# Patient Record
Sex: Female | Born: 1960 | Race: White | Hispanic: No | Marital: Married | Smoking: Former smoker
Health system: Southern US, Community
[De-identification: ages and names within clinical notes are randomized; demographics above are authoritative.]

## PROBLEM LIST (undated history)

## (undated) DIAGNOSIS — F329 Major depressive disorder, single episode, unspecified: Secondary | ICD-10-CM

## (undated) DIAGNOSIS — I10 Essential (primary) hypertension: Secondary | ICD-10-CM

## (undated) DIAGNOSIS — F32A Depression, unspecified: Secondary | ICD-10-CM

## (undated) DIAGNOSIS — E785 Hyperlipidemia, unspecified: Secondary | ICD-10-CM

## (undated) HISTORY — PX: TONSILLECTOMY AND ADENOIDECTOMY: SUR1326

## (undated) HISTORY — DX: Hyperlipidemia, unspecified: E78.5

## (undated) HISTORY — PX: TUBAL LIGATION: SHX77

## (undated) HISTORY — PX: APPENDECTOMY: SHX54

---

## 2012-03-15 ENCOUNTER — Ambulatory Visit (HOSPITAL_COMMUNITY)
Admission: RE | Admit: 2012-03-15 | Discharge: 2012-03-15 | Disposition: A | Discharge status: Home / Self Care | Payer: Managed Care, Other (non HMO) | Source: Ambulatory Visit | Attending: Internal Medicine | Admitting: Internal Medicine

## 2012-03-15 ENCOUNTER — Inpatient Hospital Stay (HOSPITAL_COMMUNITY)
Admission: EM | Admit: 2012-03-15 | Discharge: 2012-03-17 | DRG: 340 | Disposition: A | Discharge status: Home / Self Care | Payer: Managed Care, Other (non HMO) | Attending: General Surgery | Admitting: General Surgery

## 2012-03-15 ENCOUNTER — Encounter (HOSPITAL_COMMUNITY): Payer: Self-pay | Admitting: Emergency Medicine

## 2012-03-15 ENCOUNTER — Other Ambulatory Visit (HOSPITAL_COMMUNITY): Payer: Self-pay

## 2012-03-15 ENCOUNTER — Ambulatory Visit: Payer: Self-pay | Admitting: Internal Medicine

## 2012-03-15 DIAGNOSIS — Z87891 Personal history of nicotine dependence: Secondary | ICD-10-CM

## 2012-03-15 DIAGNOSIS — K3580 Unspecified acute appendicitis: Secondary | ICD-10-CM

## 2012-03-15 DIAGNOSIS — E669 Obesity, unspecified: Secondary | ICD-10-CM | POA: Diagnosis present

## 2012-03-15 DIAGNOSIS — K352 Acute appendicitis with generalized peritonitis, without abscess: Secondary | ICD-10-CM | POA: Insufficient documentation

## 2012-03-15 DIAGNOSIS — F329 Major depressive disorder, single episode, unspecified: Secondary | ICD-10-CM | POA: Diagnosis present

## 2012-03-15 DIAGNOSIS — K7689 Other specified diseases of liver: Secondary | ICD-10-CM | POA: Insufficient documentation

## 2012-03-15 DIAGNOSIS — D72829 Elevated white blood cell count, unspecified: Secondary | ICD-10-CM

## 2012-03-15 DIAGNOSIS — I10 Essential (primary) hypertension: Secondary | ICD-10-CM | POA: Diagnosis present

## 2012-03-15 DIAGNOSIS — R109 Unspecified abdominal pain: Secondary | ICD-10-CM

## 2012-03-15 DIAGNOSIS — K35209 Acute appendicitis with generalized peritonitis, without abscess, unspecified as to perforation: Secondary | ICD-10-CM | POA: Insufficient documentation

## 2012-03-15 DIAGNOSIS — Z6833 Body mass index (BMI) 33.0-33.9, adult: Secondary | ICD-10-CM

## 2012-03-15 DIAGNOSIS — F3289 Other specified depressive episodes: Secondary | ICD-10-CM | POA: Diagnosis present

## 2012-03-15 HISTORY — DX: Essential (primary) hypertension: I10

## 2012-03-15 HISTORY — DX: Depression, unspecified: F32.A

## 2012-03-15 HISTORY — DX: Major depressive disorder, single episode, unspecified: F32.9

## 2012-03-15 LAB — POCT UA - MICROSCOPIC ONLY
Crystals, Ur, HPF, POC: NEGATIVE
Yeast, UA: NEGATIVE

## 2012-03-15 LAB — POCT CBC
Granulocyte percent: 84.1 %G — AB (ref 37–80)
Hemoglobin: 11.9 g/dL — AB (ref 12.2–16.2)
Lymph, poc: 2.4 (ref 0.6–3.4)
MCHC: 32 g/dL (ref 31.8–35.4)
MPV: 7.7 fL (ref 0–99.8)
POC Granulocyte: 17.3 — AB (ref 2–6.9)
POC MID %: 4.1 %M (ref 0–12)
RDW, POC: 12.6 %

## 2012-03-15 LAB — POCT URINALYSIS DIPSTICK
Nitrite, UA: NEGATIVE
Protein, UA: NEGATIVE
pH, UA: 5.5

## 2012-03-15 MED ORDER — SODIUM CHLORIDE 0.9 % IV SOLN
INTRAVENOUS | Status: DC
  Administered 2012-03-15 – 2012-03-16 (×2): via INTRAVENOUS

## 2012-03-15 MED ORDER — IOHEXOL 300 MG/ML  SOLN
100.0000 mL | Freq: Once | INTRAMUSCULAR | Status: AC | PRN
  Administered 2012-03-15: 100 mL via INTRAVENOUS

## 2012-03-15 MED ORDER — SODIUM CHLORIDE 0.9 % IV SOLN
1.0000 g | Freq: Once | INTRAVENOUS | Status: AC
  Administered 2012-03-15: 23:00:00 via INTRAVENOUS
  Filled 2012-03-15: qty 1

## 2012-03-15 MED ORDER — BUPIVACAINE HCL (PF) 0.25 % IJ SOLN
INTRAMUSCULAR | Status: AC
  Filled 2012-03-15: qty 30

## 2012-03-15 MED ORDER — ONDANSETRON HCL 4 MG/2ML IJ SOLN
4.0000 mg | INTRAMUSCULAR | Status: DC | PRN
  Administered 2012-03-16: 4 mg via INTRAVENOUS
  Filled 2012-03-15: qty 2

## 2012-03-15 NOTE — ED Notes (Signed)
Pt states she started having abd pain yesterday that has stayed through today  Pt went to urgent care today and had blood work done that showed elevated WBC so they then ordered a CT scan that was positive for appendicitis  Pt sent here for further treatment

## 2012-03-15 NOTE — H&P (Signed)
Candice Mcbride is an 51 y.o. female.    Chief Complaint: abdominal pain, acute appendicitis  HPI: Patient is a 51 yo WF with > 24 hour hx of abdominal pain localizing to the RLQ.  Mild nausea and emesis.  Seen today by primary MD.  WBC elevated at 20K.  CT abdomen obtained at hospital - positive for acute appendicitis with probable perforation and appendicolith.  Will admit to general surgery service and proceed with appendectomy now.  Past Medical History  Diagnosis Date  . Hypertension   . Depression     Past Surgical History  Procedure Date  . Cesarean section   . Tonsillectomy and adenoidectomy     Family History  Problem Relation Age of Onset  . Heart attack Father   . Hypertension Other   . Diabetes Other    Social History:  reports that she has quit smoking. She does not have any smokeless tobacco history on file. She reports that she drinks alcohol. She reports that she does not use illicit drugs.  Allergies:  Allergies  Allergen Reactions  . Codeine     Headache   . Morphine And Related     Headache      (Not in a hospital admission)  Results for orders placed in visit on 03/15/12 (from the past 48 hour(s))  POCT CBC     Status: Abnormal   Collection Time   03/15/12  6:56 PM      Component Value Range Comment   WBC 20.6 (*) 4.6 - 10.2 (K/uL)    Lymph, poc 2.4  0.6 - 3.4     POC LYMPH PERCENT 11.8  10 - 50 (%L)    MID (cbc) 0.8  0 - 0.9     POC MID % 4.1  0 - 12 (%M)    POC Granulocyte 17.3 (*) 2 - 6.9     Granulocyte percent 84.1 (*) 37 - 80 (%G)    RBC 3.97 (*) 4.04 - 5.48 (M/uL)    Hemoglobin 11.9 (*) 12.2 - 16.2 (g/dL)    HCT, POC 04.5 (*) 40.9 - 47.9 (%)    MCV 93.6  80 - 97 (fL)    MCH, POC 30.0  27 - 31.2 (pg)    MCHC 32.0  31.8 - 35.4 (g/dL)    RDW, POC 81.1      Platelet Count, POC 353  142 - 424 (K/uL)    MPV 7.7  0 - 99.8 (fL)   POCT UA - MICROSCOPIC ONLY     Status: Normal   Collection Time   03/15/12  6:57 PM      Component Value Range  Comment   WBC, Ur, HPF, POC 3-6      RBC, urine, microscopic neg      Bacteria, U Microscopic trace      Mucus, UA neg      Epithelial cells, urine per micros 1-3      Crystals, Ur, HPF, POC neg      Casts, Ur, LPF, POC neg      Yeast, UA neg     POCT URINALYSIS DIPSTICK     Status: Normal   Collection Time   03/15/12  6:57 PM      Component Value Range Comment   Color, UA yellow      Clarity, UA clear      Glucose, UA neg      Bilirubin, UA neg      Ketones, UA neg  Spec Grav, UA 1.025      Blood, UA neg      pH, UA 5.5      Protein, UA neg      Urobilinogen, UA 0.2      Nitrite, UA neg      Leukocytes, UA Trace      Ct Abdomen Pelvis W Contrast  03/15/2012  *RADIOLOGY REPORT*  Clinical Data: 51 year old female with right lower quadrant pain and leukocytosis.  CT ABDOMEN AND PELVIS WITH CONTRAST  Technique:  Multidetector CT imaging of the abdomen and pelvis was performed following the standard protocol during bolus administration of intravenous contrast.  Contrast: OMNIPAQUE IOHEXOL 300 MG/ML  SOLN  Comparison: None.  Findings: Minor lung base atelectasis.  No pleural effusion. No acute osseous abnormality identified.  No pelvic free fluid. Negative bladder.  Negative uterus and left adnexa.  The right adnexa is in proximity to the right lower quadrant inflammatory changes, see below.  Negative distal colon.  Negative transverse colon and hepatic flexure.  There are mild inflammatory changes where the appendix emerges from the cecum, and the appendix.  Dilated up to 9 mm.  There is evidence of an appendicolith in the more distal appendix (series 2 image 61).  The tip of the appendix appears to a ruptured with trace extraluminal gas and fluid (coronal images 59, 62) and surrounding inflammatory stranding.  The terminal ileum and right adnexa are inseparable from a portion of the appendix (series 2 image 63).  The cecum otherwise is not inflamed.  Oral contrast has not yet reached  the terminal ileum.  No dilated small bowel loops.  No free intraperitoneal air.  Negative stomach, duodenum, gallbladder, spleen, pancreas, adrenal glands, kidneys and portal venous system. Major arterial structures are normal except for mild atherosclerosis.  Diffuse liver low density in keeping with steatosis.  Mild fatty sparing in the gallbladder fossa.  No abdominal free fluid.  IMPRESSION: 1.  Acute appendicitis with a small contained perforation at the appendiceal tip. Appendicolith in the midportion of the appendix. 2.  No abscess.  The terminal ileum and right adnexa are inseparable from the inflammatory change. 3.  Hepatic steatosis.  Message left for Dr. Merla Riches at 2130 hrs on 03/15/2012.  The patient is being held in the CT area at [facility].  Additional attempts will be made to reach Dr. Merla Riches, but regardless the patient will be transferred shortly to the [facility] ED for further evaluation and care.  Original Report Authenticated By: Harley Hallmark, M.D.    Review of Systems  Constitutional: Positive for fever, chills and diaphoresis. Negative for weight loss and malaise/fatigue.  HENT: Negative.   Eyes: Negative.   Respiratory: Negative.   Cardiovascular: Negative.   Gastrointestinal: Positive for nausea, vomiting and abdominal pain. Negative for heartburn, diarrhea, constipation, blood in stool and melena.  Genitourinary: Negative.   Musculoskeletal: Negative.   Skin: Negative.   Neurological: Negative.  Negative for weakness.  Endo/Heme/Allergies: Negative.   Psychiatric/Behavioral: Negative.     Blood pressure 108/69, pulse 113, temperature 98 F (36.7 C), temperature source Oral, resp. rate 20, weight 197 lb (89.359 kg), SpO2 99.00%. Physical Exam  Constitutional: She is oriented to person, place, and time. She appears well-developed and well-nourished.  HENT:  Head: Normocephalic and atraumatic.  Right Ear: External ear normal.  Left Ear: External ear normal.  Nose: Nose  normal.  Mouth/Throat: Oropharynx is clear and moist.  Eyes: Conjunctivae and EOM are normal. Pupils are equal, round,  and reactive to light.  Neck: Normal range of motion. Neck supple. No tracheal deviation present. No thyromegaly present.  Cardiovascular: Normal rate, regular rhythm, normal heart sounds and intact distal pulses.  Exam reveals no gallop.   No murmur heard. Respiratory: Effort normal and breath sounds normal. She has no wheezes. She has no rales.  GI: Soft. Bowel sounds are normal. She exhibits no distension and no mass. There is tenderness (right lower quadrant). There is rebound and guarding.  Musculoskeletal: Normal range of motion.  Lymphadenopathy:    She has no cervical adenopathy.  Neurological: She is alert and oriented to person, place, and time. She has normal reflexes.  Skin: Skin is warm and dry.  Psychiatric: She has a normal mood and affect. Her behavior is normal. Judgment and thought content normal.     Assessment/Plan 1.  Acute appendicitis  - start IV Unasyn now  - NPO  - to OR for appendectomy 2.  Hypertension, controlled 3.  Obesity  I discussed the diagnosis and the procedure of laparoscopic appendectomy with the patient at length.  Dr. Yolanda Bonine was at the bedside, and I spoke with her husband by telephone at the bedside.  She understands and wishes to proceed.  The risks and benefits of the procedure have been discussed at length with the patient.  The patient understands the proposed procedure, potential alternative treatments, and the course of recovery to be expected.  All of the patient's questions have been answered at this time.  The patient wishes to proceed with surgery.  Velora Heckler, MD, [facility] Office: [phone]    Turner Kunzman Judie Petit 03/15/2012, 11:40 PM

## 2012-03-15 NOTE — Progress Notes (Signed)
  Subjective:    Patient ID: Candice Mcbride, female    DOB: Mar 11, 1961, 51 y.o.   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  Respiratory: Negative for shortness of breath.   Cardiovascular: Negative for chest pain and leg swelling.  Genitourinary: Negative for dysuria, urgency, frequency, flank pain, vaginal bleeding and difficulty urinating.  Musculoskeletal: Negative for back pain.  Menopause 4 years ago on meds for hypertension and depression     Objective:   Physical ExamObviously uncomfortable Chest clear Heart regular without murmurs rubs or gallops Abdomen soft with guarding over the lower quadrants Bowel sounds very active Tender to palpation in both lower quadrants Right lower quadrant with rebound tenderness and tenderness to percussion      Results for orders placed in visit on 03/15/12  POCT CBC      Component Value Range   WBC 20.6 (*) 4.6 - 10.2 (K/uL)   Lymph, poc 2.4  0.6 - 3.4    POC LYMPH PERCENT 11.8  10 - 50 (%L)   MID (cbc) 0.8  0 - 0.9    POC MID % 4.1  0 - 12 (%M)   POC Granulocyte 17.3 (*) 2 - 6.9    Granulocyte percent 84.1 (*) 37 - 80 (%G)   RBC 3.97 (*) 4.04 - 5.48 (M/uL)   Hemoglobin 11.9 (*) 12.2 - 16.2 (g/dL)   HCT, POC 14.7 (*) 82.9 - 47.9 (%)   MCV 93.6  80 - 97 (fL)   MCH, POC 30.0  27 - 31.2 (pg)   MCHC 32.0  31.8 - 35.4 (g/dL)   RDW, POC 56.2     Platelet Count, POC 353  142 - 424 (K/uL)   MPV 7.7  0 - 99.8 (fL)  POCT UA - MICROSCOPIC ONLY      Component Value Range   WBC, Ur, HPF, POC 3-6     RBC,  urine, microscopic neg     Bacteria, U Microscopic trace     Mucus, UA neg     Epithelial cells, urine per micros 1-3     Crystals, Ur, HPF, POC neg     Casts, Ur, LPF, POC neg     Yeast, UA neg    POCT URINALYSIS DIPSTICK      Component Value Range   Color, UA yellow     Clarity, UA clear     Glucose, UA neg     Bilirubin, UA neg     Ketones, UA neg     Spec Grav, UA 1.025     Blood, UA neg     pH, UA 5.5     Protein, UA neg     Urobilinogen, UA 0.2     Nitrite, UA neg     Leukocytes, UA Trace      Assessment & Plan:  Abdominal pain right lower quadrant Leukocytosis  Needs CT of the abdomen and pelvis to rule out acute appendicitis-to [facility] now

## 2012-03-15 NOTE — ED Provider Notes (Signed)
History     CSN: [id number]  Arrival date & time 03/15/12  2141   First MD Initiated Contact with Patient 03/15/12 2231      Chief Complaint  Patient presents with  . Abdominal Pain    HPI Pt was seen at 2230.  Per pt, c/o gradual onset and worsening of constant abd "pain" since yesterday.  Has been associated with nausea.  Pt states she was eval by her PMD today, had labs and CT scan completed.  Pt was then sent to the ED for admission for acute appendicitis.  Last PO intake approx 1600 today (soda with motrin).  Denies fevers, no back pain, no CP/SOB, no rash.     Past Medical History  Diagnosis Date  . Hypertension   . Depression     Past Surgical History  Procedure Date  . Cesarean section   . Tonsillectomy and adenoidectomy     Family History  Problem Relation Age of Onset  . Heart attack Father   . Hypertension Other   . Diabetes Other     History  Substance Use Topics  . Smoking status: Former Games developer  . Smokeless tobacco: Not on file  . Alcohol Use: Yes     occassional    Review of Systems ROS: Statement: All systems negative except as marked or noted in the HPI; Constitutional: Negative for fever and chills. ; ; Eyes: Negative for eye pain, redness and discharge. ; ; ENMT: Negative for ear pain, hoarseness, nasal congestion, sinus pressure and sore throat. ; ; Cardiovascular: Negative for chest pain, palpitations, diaphoresis, dyspnea and peripheral edema. ; ; Respiratory: Negative for cough, wheezing and stridor. ; ; Gastrointestinal: +nausea, abd pain.  Negative for vomiting, diarrhea, blood in stool, hematemesis, jaundice and rectal bleeding. . ; ; Genitourinary: Negative for dysuria, flank pain and hematuria. ; ; Musculoskeletal: Negative for back pain and neck pain. Negative for swelling and trauma.; ; Skin: Negative for pruritus, rash, abrasions, blisters, bruising and skin lesion.; ; Neuro: Negative for headache, lightheadedness and neck stiffness. Negative  for weakness, altered level of consciousness , altered mental status, extremity weakness, paresthesias, involuntary movement, seizure and syncope.      Allergies  Codeine and Morphine and related  Home Medications   Current Outpatient Rx  Name Route Sig Dispense Refill  . CITALOPRAM HYDROBROMIDE 40 MG PO TABS Oral Take 40 mg by mouth daily.    Marland Kitchen LISINOPRIL-HYDROCHLOROTHIAZIDE 20-25 MG PO TABS Oral Take 1 tablet by mouth daily.      BP 108/69  Pulse 113  Temp(Src) 98 F (36.7 C) (Oral)  Resp 20  Wt 197 lb (89.359 kg)  SpO2 99%  Physical Exam 2235: Physical examination:  Nursing notes reviewed; Vital signs and O2 SAT reviewed;  Constitutional: Well developed, Well nourished, In no acute distress; Head:  Normocephalic, atraumatic; Eyes: EOMI, PERRL, No scleral icterus; ENMT: Mouth and pharynx normal, Mucous membranes dry; Neck: Supple, Full range of motion, No lymphadenopathy; Cardiovascular: Regular rate and rhythm, No murmur, rub, or gallop; Respiratory: Breath sounds clear & equal bilaterally, No rales, rhonchi, wheezes, Normal respiratory effort/excursion; Chest: Nontender, Movement normal; Abdomen: Soft, +RLQ tender to palp, Nondistended, Normal bowel sounds; Extremities: Pulses normal, No tenderness, No edema, No calf edema or asymmetry.; Neuro: AA&Ox3, Major CN grossly intact.  No gross focal motor or sensory deficits in extremities.; Skin: Color normal, Warm, Dry, no rash.    ED Course  Procedures    MDM  MDM Reviewed: nursing note, vitals  and previous chart Reviewed previous: labs and CT scan   Results for orders placed in visit on 03/15/12  POCT CBC      Component Value Range   WBC 20.6 (*) 4.6 - 10.2 (K/uL)   Lymph, poc 2.4  0.6 - 3.4    POC LYMPH PERCENT 11.8  10 - 50 (%L)   MID (cbc) 0.8  0 - 0.9    POC MID % 4.1  0 - 12 (%M)   POC Granulocyte 17.3 (*) 2 - 6.9    Granulocyte percent 84.1 (*) 37 - 80 (%G)   RBC 3.97 (*) 4.04 - 5.48 (M/uL)   Hemoglobin 11.9  (*) 12.2 - 16.2 (g/dL)   HCT, POC 16.1 (*) 09.6 - 47.9 (%)   MCV 93.6  80 - 97 (fL)   MCH, POC 30.0  27 - 31.2 (pg)   MCHC 32.0  31.8 - 35.4 (g/dL)   RDW, POC 04.5     Platelet Count, POC 353  142 - 424 (K/uL)   MPV 7.7  0 - 99.8 (fL)  POCT UA - MICROSCOPIC ONLY      Component Value Range   WBC, Ur, HPF, POC 3-6     RBC, urine, microscopic neg     Bacteria, U Microscopic trace     Mucus, UA neg     Epithelial cells, urine per micros 1-3     Crystals, Ur, HPF, POC neg     Casts, Ur, LPF, POC neg     Yeast, UA neg    POCT URINALYSIS DIPSTICK      Component Value Range   Color, UA yellow     Clarity, UA clear     Glucose, UA neg     Bilirubin, UA neg     Ketones, UA neg     Spec Grav, UA 1.025     Blood, UA neg     pH, UA 5.5     Protein, UA neg     Urobilinogen, UA 0.2     Nitrite, UA neg     Leukocytes, UA Trace     Ct Abdomen Pelvis W Contrast 03/15/2012  *RADIOLOGY REPORT*  Clinical Data: 51 year old female with right lower quadrant pain and leukocytosis.  CT ABDOMEN AND PELVIS WITH CONTRAST  Technique:  Multidetector CT imaging of the abdomen and pelvis was performed following the standard protocol during bolus administration of intravenous contrast.  Contrast: OMNIPAQUE IOHEXOL 300 MG/ML  SOLN  Comparison: None.  Findings: Minor lung base atelectasis.  No pleural effusion. No acute osseous abnormality identified.  No pelvic free fluid. Negative bladder.  Negative uterus and left adnexa.  The right adnexa is in proximity to the right lower quadrant inflammatory changes, see below.  Negative distal colon.  Negative transverse colon and hepatic flexure.  There are mild inflammatory changes where the appendix emerges from the cecum, and the appendix.  Dilated up to 9 mm.  There is evidence of an appendicolith in the more distal appendix (series 2 image 61).  The tip of the appendix appears to a ruptured with trace extraluminal gas and fluid (coronal images 59, 62) and surrounding  inflammatory stranding.  The terminal ileum and right adnexa are inseparable from a portion of the appendix (series 2 image 63).  The cecum otherwise is not inflamed.  Oral contrast has not yet reached the terminal ileum.  No dilated small bowel loops.  No free intraperitoneal air.  Negative stomach, duodenum, gallbladder, spleen, pancreas,  adrenal glands, kidneys and portal venous system. Major arterial structures are normal except for mild atherosclerosis.  Diffuse liver low density in keeping with steatosis.  Mild fatty sparing in the gallbladder fossa.  No abdominal free fluid.  IMPRESSION: 1.  Acute appendicitis with a small contained perforation at the appendiceal tip. Appendicolith in the midportion of the appendix. 2.  No abscess.  The terminal ileum and right adnexa are inseparable from the inflammatory change. 3.  Hepatic steatosis.  Message left for Dr. Merla Riches at 2130 hrs on 03/15/2012.  The patient is being held in the CT area at [facility].  Additional attempts will be made to reach Dr. Merla Riches, but regardless the patient will be transferred shortly to the [facility] ED for further evaluation and care.  Original Report Authenticated By: Harley Hallmark, M.D.      10:47 PM:   Dx testing d/w pt and family.  Questions answered.  Verb understanding, agreeable to admit.  T/C to General Surgeon Dr. Gerrit Friends, case discussed, including:  HPI, pertinent PM/SHx, VS/PE, dx testing, ED course and treatment:  Agreeable to admit, will come to ED for eval.        Laray Anger, DO 03/16/12 1122

## 2012-03-16 ENCOUNTER — Encounter (HOSPITAL_COMMUNITY): Payer: Self-pay | Admitting: Anesthesiology

## 2012-03-16 ENCOUNTER — Inpatient Hospital Stay (HOSPITAL_COMMUNITY): Payer: Managed Care, Other (non HMO) | Admitting: Anesthesiology

## 2012-03-16 ENCOUNTER — Encounter (HOSPITAL_COMMUNITY): Admission: EM | Disposition: A | Discharge status: Home / Self Care | Payer: Self-pay | Source: Home / Self Care

## 2012-03-16 HISTORY — PX: LAPAROSCOPIC APPENDECTOMY: SHX408

## 2012-03-16 LAB — COMPREHENSIVE METABOLIC PANEL
ALT: 27 U/L (ref 0–35)
Alkaline Phosphatase: 64 U/L (ref 39–117)
CO2: 26 mEq/L (ref 19–32)
Sodium: 137 mEq/L (ref 135–145)
Total Bilirubin: 0.7 mg/dL (ref 0.3–1.2)
Total Protein: 7.7 g/dL (ref 6.0–8.3)

## 2012-03-16 SURGERY — APPENDECTOMY, LAPAROSCOPIC
Anesthesia: General | Site: Abdomen | Wound class: Dirty or Infected

## 2012-03-16 MED ORDER — ROCURONIUM BROMIDE 100 MG/10ML IV SOLN
INTRAVENOUS | Status: DC | PRN
  Administered 2012-03-16: 30 mg via INTRAVENOUS

## 2012-03-16 MED ORDER — CITALOPRAM HYDROBROMIDE 40 MG PO TABS
40.0000 mg | ORAL_TABLET | Freq: Every day | ORAL | Status: DC
  Administered 2012-03-16 – 2012-03-17 (×2): 40 mg via ORAL
  Filled 2012-03-16 (×2): qty 1

## 2012-03-16 MED ORDER — HYDROCHLOROTHIAZIDE 25 MG PO TABS
25.0000 mg | ORAL_TABLET | Freq: Every day | ORAL | Status: DC
  Administered 2012-03-16 – 2012-03-17 (×2): 25 mg via ORAL
  Filled 2012-03-16 (×2): qty 1

## 2012-03-16 MED ORDER — HYDROMORPHONE HCL PF 1 MG/ML IJ SOLN
1.0000 mg | Freq: Once | INTRAMUSCULAR | Status: AC
  Administered 2012-03-16: 1 mg via INTRAVENOUS
  Filled 2012-03-16: qty 1

## 2012-03-16 MED ORDER — LISINOPRIL-HYDROCHLOROTHIAZIDE 20-25 MG PO TABS: 1.0000 | ORAL_TABLET | Freq: Every day | ORAL | Status: DC

## 2012-03-16 MED ORDER — KCL IN DEXTROSE-NACL 30-5-0.45 MEQ/L-%-% IV SOLN
INTRAVENOUS | Status: DC
  Administered 2012-03-16 (×2): via INTRAVENOUS
  Filled 2012-03-16 (×3): qty 1000

## 2012-03-16 MED ORDER — DEXAMETHASONE SODIUM PHOSPHATE 10 MG/ML IJ SOLN
INTRAMUSCULAR | Status: DC | PRN
  Administered 2012-03-16: 10 mg via INTRAVENOUS

## 2012-03-16 MED ORDER — ONDANSETRON HCL 4 MG PO TABS: 4.0000 mg | ORAL_TABLET | Freq: Four times a day (QID) | ORAL | Status: DC | PRN

## 2012-03-16 MED ORDER — HYDROMORPHONE HCL PF 1 MG/ML IJ SOLN: 1.0000 mg | INTRAMUSCULAR | Status: DC | PRN

## 2012-03-16 MED ORDER — LIDOCAINE HCL (CARDIAC) 20 MG/ML IV SOLN
INTRAVENOUS | Status: DC | PRN
  Administered 2012-03-16: 50 mg via INTRAVENOUS

## 2012-03-16 MED ORDER — PROMETHAZINE HCL 25 MG/ML IJ SOLN
6.2500 mg | INTRAMUSCULAR | Status: DC | PRN
  Administered 2012-03-16: 6.25 mg via INTRAVENOUS

## 2012-03-16 MED ORDER — BUPIVACAINE-EPINEPHRINE 0.25% -1:200000 IJ SOLN
INTRAMUSCULAR | Status: DC | PRN
  Administered 2012-03-16: 21 mL

## 2012-03-16 MED ORDER — NEOSTIGMINE METHYLSULFATE 1 MG/ML IJ SOLN
INTRAMUSCULAR | Status: DC | PRN
  Administered 2012-03-16: 5 mg via INTRAVENOUS

## 2012-03-16 MED ORDER — CALCIUM CARBONATE ANTACID 500 MG PO CHEW
400.0000 mg | CHEWABLE_TABLET | ORAL | Status: DC | PRN
  Administered 2012-03-16 – 2012-03-17 (×2): 400 mg via ORAL
  Filled 2012-03-16: qty 2

## 2012-03-16 MED ORDER — SUCCINYLCHOLINE CHLORIDE 20 MG/ML IJ SOLN
INTRAMUSCULAR | Status: DC | PRN
  Administered 2012-03-16: 100 mg via INTRAVENOUS

## 2012-03-16 MED ORDER — SODIUM CHLORIDE 0.9 % IV SOLN
1.0000 g | Freq: Every day | INTRAVENOUS | Status: DC
  Administered 2012-03-16: 1 g via INTRAVENOUS
  Filled 2012-03-16: qty 1

## 2012-03-16 MED ORDER — PROPOFOL 10 MG/ML IV EMUL
INTRAVENOUS | Status: DC | PRN
  Administered 2012-03-16: 180 mg via INTRAVENOUS

## 2012-03-16 MED ORDER — GLYCOPYRROLATE 0.2 MG/ML IJ SOLN
INTRAMUSCULAR | Status: DC | PRN
  Administered 2012-03-16: .6 mg via INTRAVENOUS

## 2012-03-16 MED ORDER — PROMETHAZINE HCL 25 MG/ML IJ SOLN
INTRAMUSCULAR | Status: AC
  Filled 2012-03-16: qty 1

## 2012-03-16 MED ORDER — ONDANSETRON HCL 4 MG/2ML IJ SOLN
INTRAMUSCULAR | Status: DC | PRN
  Administered 2012-03-16: 4 mg via INTRAVENOUS

## 2012-03-16 MED ORDER — LACTATED RINGERS IV SOLN
INTRAVENOUS | Status: DC | PRN
  Administered 2012-03-16 (×2): via INTRAVENOUS

## 2012-03-16 MED ORDER — ONDANSETRON HCL 4 MG/2ML IJ SOLN: 4.0000 mg | Freq: Four times a day (QID) | INTRAMUSCULAR | Status: DC | PRN

## 2012-03-16 MED ORDER — LISINOPRIL 20 MG PO TABS
20.0000 mg | ORAL_TABLET | Freq: Every day | ORAL | Status: DC
  Administered 2012-03-16 – 2012-03-17 (×2): 20 mg via ORAL
  Filled 2012-03-16 (×2): qty 1

## 2012-03-16 MED ORDER — HYDROMORPHONE HCL PF 1 MG/ML IJ SOLN: 0.2500 mg | INTRAMUSCULAR | Status: DC | PRN

## 2012-03-16 MED ORDER — FENTANYL CITRATE 0.05 MG/ML IJ SOLN
INTRAMUSCULAR | Status: DC | PRN
  Administered 2012-03-16: 100 ug via INTRAVENOUS

## 2012-03-16 MED ORDER — IBUPROFEN 600 MG PO TABS
600.0000 mg | ORAL_TABLET | Freq: Four times a day (QID) | ORAL | Status: DC | PRN
  Administered 2012-03-16 (×2): 600 mg via ORAL
  Filled 2012-03-16 (×2): qty 1

## 2012-03-16 MED ORDER — LACTATED RINGERS IV SOLN
INTRAVENOUS | Status: DC | PRN
  Administered 2012-03-16: 1000 mL via INTRAVENOUS

## 2012-03-16 SURGICAL SUPPLY — 39 items
APPLIER CLIP ROT 10 11.4 M/L (STAPLE)
BENZOIN TINCTURE PRP APPL 2/3 (GAUZE/BANDAGES/DRESSINGS) ×2 IMPLANT
CANISTER SUCTION 2500CC (MISCELLANEOUS) ×2 IMPLANT
CLIP APPLIE ROT 10 11.4 M/L (STAPLE) IMPLANT
CLOTH BEACON ORANGE TIMEOUT ST (SAFETY) ×2 IMPLANT
CLSR STERI-STRIP ANTIMIC 1/2X4 (GAUZE/BANDAGES/DRESSINGS) ×2 IMPLANT
CUTTER FLEX LINEAR 45M (STAPLE) ×2 IMPLANT
DECANTER SPIKE VIAL GLASS SM (MISCELLANEOUS) IMPLANT
DRAPE LAPAROSCOPIC ABDOMINAL (DRAPES) ×2 IMPLANT
ELECT REM PT RETURN 9FT ADLT (ELECTROSURGICAL) ×2
ELECTRODE REM PT RTRN 9FT ADLT (ELECTROSURGICAL) ×1 IMPLANT
ENDOLOOP SUT PDS II  0 18 (SUTURE)
ENDOLOOP SUT PDS II 0 18 (SUTURE) IMPLANT
GLOVE BIOGEL PI IND STRL 7.0 (GLOVE) IMPLANT
GLOVE BIOGEL PI INDICATOR 7.0 (GLOVE)
GLOVE SURG ORTHO 8.0 STRL STRW (GLOVE) ×6 IMPLANT
GOWN STRL NON-REIN LRG LVL3 (GOWN DISPOSABLE) IMPLANT
GOWN STRL REIN XL XLG (GOWN DISPOSABLE) ×4 IMPLANT
HAND ACTIVATED (MISCELLANEOUS) ×2 IMPLANT
KIT BASIN OR (CUSTOM PROCEDURE TRAY) ×2 IMPLANT
NS IRRIG 1000ML POUR BTL (IV SOLUTION) ×2 IMPLANT
PENCIL BUTTON HOLSTER BLD 10FT (ELECTRODE) IMPLANT
POUCH SPECIMEN RETRIEVAL 10MM (ENDOMECHANICALS) IMPLANT
RELOAD 45 VASCULAR/THIN (ENDOMECHANICALS) IMPLANT
RELOAD STAPLE TA45 3.5 REG BLU (ENDOMECHANICALS) ×2 IMPLANT
SET IRRIG TUBING LAPAROSCOPIC (IRRIGATION / IRRIGATOR) IMPLANT
SOLUTION ANTI FOG 6CC (MISCELLANEOUS) ×2 IMPLANT
SPONGE GAUZE 4X4 12PLY (GAUZE/BANDAGES/DRESSINGS) ×2 IMPLANT
STRIP CLOSURE SKIN 1/2X4 (GAUZE/BANDAGES/DRESSINGS) ×2 IMPLANT
SUT MNCRL AB 4-0 PS2 18 (SUTURE) ×2 IMPLANT
TAPE CLOTH SURG 4X10 WHT LF (GAUZE/BANDAGES/DRESSINGS) ×2 IMPLANT
TOWEL OR 17X26 10 PK STRL BLUE (TOWEL DISPOSABLE) ×2 IMPLANT
TRAY FOLEY CATH 14FRSI W/METER (CATHETERS) ×2 IMPLANT
TRAY LAP CHOLE (CUSTOM PROCEDURE TRAY) ×2 IMPLANT
TROCAR BLADELESS OPT 5 75 (ENDOMECHANICALS) ×2 IMPLANT
TROCAR XCEL BLUNT TIP 100MML (ENDOMECHANICALS) ×2 IMPLANT
TROCAR XCEL NON-BLD 11X100MML (ENDOMECHANICALS) ×2 IMPLANT
TUBING INSUFFLATION 10FT LAP (TUBING) ×2 IMPLANT
WATER STERILE IRR 1500ML POUR (IV SOLUTION) IMPLANT

## 2012-03-16 NOTE — Progress Notes (Signed)
Day of Surgery  Subjective: Feels so much better than yesterday. Sore, voiding well.    Objective: Vital signs in last 24 hours: Temp:  [97.3 F (36.3 C)-98.7 F (37.1 C)] 98.4 F (36.9 C) (05/07 0600) Pulse Rate:  [69-113] 72  (05/07 0600) Resp:  [12-20] 16  (05/07 0600) BP: (93-133)/(60-84) 100/62 mmHg (05/07 0600) SpO2:  [93 %-100 %] 99 % (05/07 0600) Weight:  [79.833 kg (176 lb)-98.068 kg (216 lb 3.2 oz)] 98.068 kg (216 lb 3.2 oz) (05/07 0302) Last BM Date: 03/15/12 Afebrile, VSS, No labs,   Intake/Output from previous day: 05/06 0701 - 05/07 0700 In: 1934.5 [P.O.:284; I.V.:1650.5] Out: 1025 [Urine:1025] Intake/Output this shift: Total I/O In: -  Out: 400 [Urine:400]  General appearance: alert, cooperative and no distress Resp: clear to auscultation bilaterally GI: soft, tender, few bowel sounds, incisions clean and dry dressings.  Lab Results:   [facility] 03/15/12 1856  WBC 20.6*  HGB 11.9*  HCT 37.2*  PLT --    BMET No results found for this basename: NA:2,K:2,CL:2,CO2:2,GLUCOSE:2,BUN:2,CREATININE:2,CALCIUM:2 in the last 72 hours PT/INR No results found for this basename: LABPROT:2,INR:2 in the last 72 hours  No results found for this basename: AST:5,ALT:5,ALKPHOS:5,BILITOT:5,PROT:5,ALBUMIN:5 in the last 168 hours   Lipase  No results found for this basename: lipase     Studies/Results: Ct Abdomen Pelvis W Contrast  03/15/2012  *RADIOLOGY REPORT*  Clinical Data: 51 year old female with right lower quadrant pain and leukocytosis.  CT ABDOMEN AND PELVIS WITH CONTRAST  Technique:  Multidetector CT imaging of the abdomen and pelvis was performed following the standard protocol during bolus administration of intravenous contrast.  Contrast: OMNIPAQUE IOHEXOL 300 MG/ML  SOLN  Comparison: None.  Findings: Minor lung base atelectasis.  No pleural effusion. No acute osseous abnormality identified.  No pelvic free fluid. Negative bladder.  Negative uterus and  left adnexa.  The right adnexa is in proximity to the right lower quadrant inflammatory changes, see below.  Negative distal colon.  Negative transverse colon and hepatic flexure.  There are mild inflammatory changes where the appendix emerges from the cecum, and the appendix.  Dilated up to 9 mm.  There is evidence of an appendicolith in the more distal appendix (series 2 image 61).  The tip of the appendix appears to a ruptured with trace extraluminal gas and fluid (coronal images 59, 62) and surrounding inflammatory stranding.  The terminal ileum and right adnexa are inseparable from a portion of the appendix (series 2 image 63).  The cecum otherwise is not inflamed.  Oral contrast has not yet reached the terminal ileum.  No dilated small bowel loops.  No free intraperitoneal air.  Negative stomach, duodenum, gallbladder, spleen, pancreas, adrenal glands, kidneys and portal venous system. Major arterial structures are normal except for mild atherosclerosis.  Diffuse liver low density in keeping with steatosis.  Mild fatty sparing in the gallbladder fossa.  No abdominal free fluid.  IMPRESSION: 1.  Acute appendicitis with a small contained perforation at the appendiceal tip. Appendicolith in the midportion of the appendix. 2.  No abscess.  The terminal ileum and right adnexa are inseparable from the inflammatory change. 3.  Hepatic steatosis.  Message left for Dr. Merla Riches at 2130 hrs on 03/15/2012.  The patient is being held in the CT area at [facility].  Additional attempts will be made to reach Dr. Merla Riches, but regardless the patient will be transferred shortly to the [facility] ED for further evaluation and care.  Original Report Authenticated By: H.LEE  [name], M.D.    Medications:    . citalopram  40 mg Oral Daily  . ertapenem  1 g Intravenous Once  . ertapenem  1 g Intravenous QHS  . hydrochlorothiazide  25 mg Oral Daily  .  HYDROmorphone (DILAUDID) injection  1 mg Intravenous Once  . lisinopril  20 mg  Oral Daily  . DISCONTD: lisinopril-hydrochlorothiazide  1 tablet Oral Daily    Assessment/Plan Acute appendicitis with perforation s/p Laparoscopic appendectomy 03/16/12 Dr. Gerrit Friends. Hypertension  Depression   Plan:  Advance diet, continue antibiotics, mobilize.  I will discuss with Dr. Magnus Ivan how long we need to continue antibiotics. Currently no signs of peritonitis.  Check labs AM.       LOS: 1 day    Candice Mcbride 03/16/2012

## 2012-03-16 NOTE — Transfer of Care (Signed)
Immediate Anesthesia Transfer of Care Note  Patient: Candice Mcbride  Procedure(s) Performed: Procedure(s) (LRB): APPENDECTOMY LAPAROSCOPIC (N/A)  Patient Location: PACU  Anesthesia Type: General  Level of Consciousness: awake and alert   Airway & Oxygen Therapy: Patient Spontanous Breathing and Patient connected to face mask oxygen  Post-op Assessment: Report given to PACU RN and Post -op Vital signs reviewed and stable  Post vital signs: Reviewed and stable  Complications: No apparent anesthesia complications

## 2012-03-16 NOTE — Progress Notes (Signed)
UR complete 

## 2012-03-16 NOTE — Anesthesia Postprocedure Evaluation (Signed)
  Anesthesia Post-op Note  Patient: Candice Mcbride  Procedure(s) Performed: Procedure(s) (LRB): APPENDECTOMY LAPAROSCOPIC (N/A)  Patient Location: PACU  Anesthesia Type: General  Level of Consciousness: awake and alert   Airway and Oxygen Therapy: Patient Spontanous Breathing  Post-op Pain: mild  Post-op Assessment: Post-op Vital signs reviewed, Patient's Cardiovascular Status Stable, Respiratory Function Stable, Patent Airway and No signs of Nausea or vomiting  Post-op Vital Signs: stable  Complications: No apparent anesthesia complications

## 2012-03-16 NOTE — Anesthesia Preprocedure Evaluation (Signed)
Anesthesia Evaluation  Patient identified by MRN, date of birth, ID band Patient awake    Reviewed: Allergy & Precautions, H&P , NPO status , Patient's Chart, lab work & pertinent test results  Airway Mallampati: II TM Distance: >3 FB Neck ROM: Full    Dental No notable dental hx.    Pulmonary neg pulmonary ROS,  breath sounds clear to auscultation  Pulmonary exam normal       Cardiovascular hypertension, Pt. on medications Rhythm:Regular Rate:Normal     Neuro/Psych PSYCHIATRIC DISORDERS Depression negative neurological ROS     GI/Hepatic negative GI ROS, Neg liver ROS,   Endo/Other  negative endocrine ROS  Renal/GU negative Renal ROS  negative genitourinary   Musculoskeletal negative musculoskeletal ROS (+)   Abdominal   Peds negative pediatric ROS (+)  Hematology negative hematology ROS (+)   Anesthesia Other Findings   Reproductive/Obstetrics negative OB ROS                           Anesthesia Physical Anesthesia Plan  ASA: II  Anesthesia Plan: General   Post-op Pain Management:    Induction: Intravenous  Airway Management Planned: Oral ETT  Additional Equipment:   Intra-op Plan:   Post-operative Plan: Extubation in OR  Informed Consent: I have reviewed the patients History and Physical, chart, labs and discussed the procedure including the risks, benefits and alternatives for the proposed anesthesia with the patient or authorized representative who has indicated his/her understanding and acceptance.   Dental advisory given  Plan Discussed with: CRNA  Anesthesia Plan Comments:         Anesthesia Quick Evaluation

## 2012-03-16 NOTE — Plan of Care (Signed)
Problem: Phase II Progression Outcomes Goal: Progressing with IS, TCDB Outcome: Progressing Pt instructed on how to use IS- pt returned demonstration

## 2012-03-16 NOTE — Progress Notes (Signed)
I have seen and examined the patient and agree with the assessment and plans.  Dallyn Bergland A. Halie Gass  MD, FACS  

## 2012-03-16 NOTE — Anesthesia Procedure Notes (Signed)
Procedure Name: Intubation Date/Time: 03/16/2012 12:43 AM Performed by: [place], Rice Walsh C Pre-anesthesia Checklist: Patient identified, Timeout performed, Emergency Drugs available, Suction available and Patient being monitored Patient Re-evaluated:Patient Re-evaluated prior to inductionOxygen Delivery Method: Circle system utilized Preoxygenation: Pre-oxygenation with 100% oxygen Intubation Type: IV induction, Rapid sequence and Cricoid Pressure applied Laryngoscope Size: Mac and 3 Grade View: Grade I Tube type: Oral Number of attempts: 1 Airway Equipment and Method: Stylet Placement Confirmation: ETT inserted through vocal cords under direct vision,  breath sounds checked- equal and bilateral,  positive ETCO2 and CO2 detector Secured at: 21 cm Tube secured with: Tape Dental Injury: Teeth and Oropharynx as per pre-operative assessment

## 2012-03-16 NOTE — Op Note (Signed)
OPERATIVE REPORT - LAPAROSCOPIC APPENDECTOMY  Preop diagnosis: Acute appendicitis with perforation  Postop diagnosis: Same  Procedure: Laparoscopic appendectomy  Surgeon:  Velora Heckler, MD, FACS  Anesthesia: General endotracheal  Estimated blood loss: Minimal  Preparation: Chlora-prep  Complications: None  Indications:  Patient is a 51 yo WF with > 24 hr hx of abdominal pain localized to the RLQ.  WBC elevated at 20K.  CT Abdomen demonstrates acute appendicitis with perforation.  Procedure:  Patient is brought to the operating room and placed in a supine position on the operating room table. Following administration of general anesthesia, a time out was held and the patient's name and type of procedure is confirmed. Patient is then prepped and draped in the usual strict aseptic fashion.  After ascertaining that an adequate level of anesthesia been achieved, a infraumbilical incision is made in the midline with a #15 blade. Dissection is carried down to the fascia. Fascia is incised in the midline and the peritoneal cavity is entered cautiously. A 0 Vicryl pursestring sutures placed in the fascia. An assigned cannula is introduced under direct vision and secured with the pursestring suture. Abdomen is insufflated with carbon dioxide. Laparoscope is introduced in the abdomen is explored. Operative ports are placed in the right upper quadrant and left lower quadrant. Appendix is identified.  Using an Endo GIA stapler the base of the appendix is transected at its junction with the cecal wall.  Mesoappendix is divided with the harmonic scalpel. Dissection is carried down to the tip of the appendix.  There is good approximation of tissue along the staple line. There is good hemostasis along the staple line. The appendix is placed into an Endo Catch bag and withdrawn through the umbilical port without difficulty. 0 Vicryl pursestring sutures tied securely.  Right lower quadrant is irrigated with  warm saline which is evacuated. Good hemostasis is noted. Ports are removed under direct vision. Good hemostasis is noted at the port sites. Pneumoperitoneum is released.  Skin incisions are anesthetized with local anesthetic. Wounds are closed with interrupted 4-0 Monocryl subcuticular sutures. Wounds were washed and dried and benzoin and Steri-Strips are applied. Sterile dressings are applied. Patient was awakened from anesthesia and brought to the recovery room. The patient tolerated the procedure well.  Velora Heckler, MD, FACS General & Endocrine Surgery [facility]

## 2012-03-17 LAB — CBC
MCH: 31.2 pg (ref 26.0–34.0)
MCHC: 34.1 g/dL (ref 30.0–36.0)
Platelets: 271 10*3/uL (ref 150–400)
RBC: 3.3 MIL/uL — ABNORMAL LOW (ref 3.87–5.11)

## 2012-03-17 LAB — COMPREHENSIVE METABOLIC PANEL
ALT: 16 U/L (ref 0–35)
AST: 9 U/L (ref 0–37)
CO2: 25 mEq/L (ref 19–32)
Calcium: 9.5 mg/dL (ref 8.4–10.5)
Potassium: 3.6 mEq/L (ref 3.5–5.1)
Sodium: 138 mEq/L (ref 135–145)
Total Protein: 6.6 g/dL (ref 6.0–8.3)

## 2012-03-17 MED ORDER — AMOXICILLIN-POT CLAVULANATE 875-125 MG PO TABS
1.0000 | ORAL_TABLET | Freq: Two times a day (BID) | ORAL | Status: DC
  Administered 2012-03-17: 1 via ORAL
  Filled 2012-03-17 (×2): qty 1

## 2012-03-17 MED ORDER — OXYCODONE-ACETAMINOPHEN 5-325 MG PO TABS: 1.0000 | ORAL_TABLET | ORAL | Status: AC | PRN

## 2012-03-17 MED ORDER — IBUPROFEN 200 MG PO TABS: ORAL_TABLET | ORAL | Status: DC

## 2012-03-17 MED ORDER — ACETAMINOPHEN 325 MG PO TABS: 650.0000 mg | ORAL_TABLET | Freq: Four times a day (QID) | ORAL | Status: AC | PRN

## 2012-03-17 MED ORDER — AMOXICILLIN-POT CLAVULANATE 875-125 MG PO TABS: 1.0000 | ORAL_TABLET | Freq: Two times a day (BID) | ORAL | Status: AC

## 2012-03-17 NOTE — Discharge Summary (Signed)
Physician Discharge Summary  Patient ID: Candice Mcbride MRN: [id number] DOB/AGE: 11-16-1960 50 y.o.  Admit date: 03/15/2012 Discharge date: 03/17/2012  Admission Diagnoses: Acute appendicitis with perforation Hypertension Hx. Depression Discharge Diagnoses:  Principal Problem:  *Appendicitis, acute   PROCEDURES: Laparoscopic Appendectomy 03/16/12  Hospital Course: Patient is a 51 yo WF with > 24 hour hx of abdominal pain localizing to the RLQ. Mild nausea and emesis. Seen today by primary MD. WBC elevated at 20K. CT abdomen obtained at hospital - positive for acute appendicitis with probable perforation and appendicolith. Will admit to general surgery service and proceed with appendectomy now  Pt underwent appendectomy, and transferred to the floor.  She has done well and is up to a regular diet, afebrile, she has had a bowel movement.  She is ready to go home.  We plan 5 more days of oral antibiotics, total of 7 days of antibiotics. Condition on D/C:  Improved.  Follow up:  Dr. Gerrit Friends 2 weeks  Disposition: Final discharge disposition not confirmed   Medication List  As of 03/17/2012  7:43 AM   TAKE these medications         acetaminophen 325 MG tablet   Commonly known as: TYLENOL   Take 2 tablets (650 mg total) by mouth every 6 (six) hours as needed for pain.      amoxicillin-clavulanate 875-125 MG per tablet   Commonly known as: AUGMENTIN   Take 1 tablet by mouth every 12 (twelve) hours.      citalopram 40 MG tablet   Commonly known as: CELEXA   Take 40 mg by mouth daily.      ibuprofen 200 MG tablet   Commonly known as: ADVIL,MOTRIN   2-3 tablets as needed every 6 hours.  Or you can use plain tylenol as directed.      lisinopril-hydrochlorothiazide 20-25 MG per tablet   Commonly known as: PRINZIDE,ZESTORETIC   Take 1 tablet by mouth daily.      oxyCODONE-acetaminophen 5-325 MG per tablet   Commonly known as: PERCOCET   Take 1 tablet by mouth every 4 (four) hours as  needed for pain.           Follow-up Information    Follow up with Velora Heckler, MD. Schedule an appointment as soon as possible for a visit in 2 weeks.   Contact information:   [employer] [facility] [address] [place] [address] [phone]          Signed: Sherrie George 03/17/2012, 7:43 AM

## 2012-03-17 NOTE — Progress Notes (Signed)
I have seen and examined the patient and agree with the assessment and plans.  Pavle Wiler A. Kiondra Caicedo  MD, FACS  

## 2012-03-17 NOTE — Discharge Instructions (Signed)
Laparoscopic Appendectomy Appendectomy is surgery to remove the appendix. Laparoscopic surgery uses several small cuts (incisions) instead of one large incision. Laparoscopic surgery offers a shorter recovery time and less discomfort. LET YOUR CAREGIVER KNOW ABOUT:  Allergies to food or medicine.   Medicines taken, including vitamins, dietary supplements, herbs, eyedrops, over-the-counter medicines, and creams.   Use of steroids (by mouth or creams).   Previous problems with anesthetics or numbing medicines.   History of bleeding problems or blood clots.   Previous surgery.   Other health problems, including diabetes, heart problems, lung problems, and kidney problems.   Possibility of pregnancy, if this applies.  RISKS AND COMPLICATIONS  Infection. A germ starts growing in the wound. This can usually be treated with antibiotics. In some cases, the wound will need to be opened and cleaned.   Bleeding.   Damage to other organs.   Sores (abscesses).   Chronic pain at the incision sites. This is defined as pain that lasts for more than 3 months.   Blood clots in the legs that may rarely travel to the lungs.   Infection in the lungs (pneumonia).  BEFORE THE PROCEDURE Appendectomy is usually performed immediately after an inflamed appendix (appendicitis) is diagnosed. No preparation is necessary ahead of this procedure. PROCEDURE  You will be given medicine that makes you sleep (general anesthetic). After you are asleep, a flexible tube (catheter) may be inserted into your bladder to drain your urine during surgery. The tube is removed before you wake up after surgery. When you are asleep, carbondioxide gas will be used to inflate your abdomen. This will allow your surgeon to see inside your abdomen and perform your surgery. Three small incisions will be made in your abdomen. Your surgeon will insert a thin, lighted tube (laparoscope) through one of the incisions. Your surgeon will  look through the laparoscope while performing the surgery. Other tools will be inserted through the other incisions. Laparoscopic procedures may not be appropriate when:  There is major scarring from a previous surgery.   The patient has bleeding disorders.   A pregnancy is near term.   There are other conditions which make the laparoscopic procedure impossible, such as an advanced infection or a ruptured appendix.  If your surgeon feels it is not safe to continue with the laparoscopic procedure, he or she will perform an open surgery instead. This gives the surgeon a larger view and more space to work. Open surgery requires a longer recovery time. After your appendix is removed, your incisions will be closed with stitches (sutures) or skin adhesive. AFTER THE PROCEDURE You will be taken to a recovery room. When the anesthesia has worn off, you will be returned to your hospital room. You will be given pain medicines to keep you comfortable. Ask your caregiver how long your hospital stay will be. Document Released: 06/10/2004 Document Revised: 10/16/2011 Document Reviewed: 06/26/201CCS ______[facility] LAPAROSCOPIC SURGERY: POST OP INSTRUCTIONS Always review your discharge instruction sheet given to you by the facility where your surgery was performed. IF YOU HAVE DISABILITY OR FAMILY LEAVE FORMS, YOU MUST BRING THEM TO THE OFFICE FOR PROCESSING.   DO NOT GIVE THEM TO YOUR DOCTOR.  1. A prescription for pain medication may be given to you upon discharge.  Take your pain medication as prescribed, if needed.  If narcotic pain medicine is not needed, then you may take acetaminophen (Tylenol) or ibuprofen (Advil) as needed. 2. Take your usually prescribed medications unless otherwise directed.  3. If you need a refill on your pain medication, please contact your pharmacy.  They will contact our office to request authorization. Prescriptions will not be filled after 5pm or on  week-ends. 4. You should follow a light diet the first few days after arrival home, such as soup and crackers, etc.  Be sure to include lots of fluids daily. 5. Most patients will experience some swelling and bruising in the area of the incisions.  Ice packs will help.  Swelling and bruising can take several days to resolve.  6. It is common to experience some constipation if taking pain medication after surgery.  Increasing fluid intake and taking a stool softener (such as Colace) will usually help or prevent this problem from occurring.  A mild laxative (Milk of Magnesia or Miralax) should be taken according to package instructions if there are no bowel movements after 48 hours. 7. Unless discharge instructions indicate otherwise, you may remove your bandages 24-48 hours after surgery, and you may shower at that time.  You may have steri-strips (small skin tapes) in place directly over the incision.  These strips should be left on the skin for 7-10 days.  If your surgeon used skin glue on the incision, you may shower in 24 hours.  The glue will flake off over the next 2-3 weeks.  Any sutures or staples will be removed at the office during your follow-up visit. 8. ACTIVITIES:  You may resume regular (light) daily activities beginning the next day--such as daily self-care, walking, climbing stairs--gradually increasing activities as tolerated.  You may have sexual intercourse when it is comfortable.  Refrain from any heavy lifting or straining until approved by your doctor. a. You may drive when you are no longer taking prescription pain medication, you can comfortably wear a seatbelt, and you can safely maneuver your car and apply brakes. b. RETURN TO WORK:  __________________________________________________________ 9. You should see your doctor in the office for a follow-up appointment approximately 2-3 weeks after your surgery.  Make sure that you call for this appointment within a day or two after you  arrive home to insure a convenient appointment time. 10. OTHER INSTRUCTIONS: __________________________________________________________________________________________________________________________ __________________________________________________________________________________________________________________________ WHEN TO CALL YOUR DOCTOR: 1. Fever over 101.0 2. Inability to urinate 3. Continued bleeding from incision. 4. Increased pain, redness, or drainage from the incision. 5. Increasing abdominal pain  The clinic staff is available to answer your questions during regular business hours.  Please don't hesitate to call and ask to speak to one of the nurses for clinical concerns.  If you have a medical emergency, go to the nearest emergency room or call 911.  A surgeon from [facility] is always on call at the hospital. [address], [address], [place], [place]  [address] ? [address] [address], [place], [place]   [address] [phone] ? [phone] ? FAX [phone] Web site: [email or web address] 2 Exit[employer] Patient Information 2012 [place], [place].

## 2012-03-17 NOTE — Discharge Summary (Signed)
I have seen and examined the patient and agree with the assessment and plans.  Maddison Kilner A. Teri Legacy  MD, FACS  

## 2012-03-17 NOTE — Progress Notes (Signed)
1 Day Post-Op  Subjective: Feels better taking PO's well.  + BM, doing well with pain,.  Objective: Vital signs in last 24 hours: Temp:  [97.4 F (36.3 C)-98.4 F (36.9 C)] 97.9 F (36.6 C) (05/08 0620) Pulse Rate:  [67-78] 72  (05/08 0620) Resp:  [16-18] 18  (05/08 0620) BP: (99-122)/(62-74) 122/74 mmHg (05/08 0620) SpO2:  [94 %-98 %] 96 % (05/08 0620) Last BM Date: 03/15/12  Afebrile, VSS,WBC improving  Intake/Output from previous day: 05/07 0701 - 05/08 0700 In: 1738.8 [I.V.:1738.8] Out: 3850 [Urine:3850] Intake/Output this shift:    General appearance: alert, cooperative and no distress Resp: clear to auscultation bilaterally GI: soft, incisions look good.  few BS, +BM.  Lab Results:   Basename 03/17/12 0445 03/15/12 1856  WBC 12.6* 20.6*  HGB 10.3* 11.9*  HCT 30.2* 37.2*  PLT 271 --    BMET  Basename 03/17/12 0445 03/15/12 1858  NA 138 137  K 3.6 3.6  CL 104 98  CO2 25 26  GLUCOSE 154* 128*  BUN 13 11  CREATININE 0.95 1.09  CALCIUM 9.5 9.9   PT/INR No results found for this basename: LABPROT:2,INR:2 in the last 72 hours   Lab 03/17/12 0445 03/15/12 1858  AST 9 15  ALT 16 27  ALKPHOS 49 64  BILITOT 0.2* 0.7  PROT 6.6 7.7  ALBUMIN 3.2* 5.0     Lipase  No results found for this basename: lipase     Studies/Results: Ct Abdomen Pelvis W Contrast  03/15/2012  *RADIOLOGY REPORT*  Clinical Data: 51 year old female with right lower quadrant pain and leukocytosis.  CT ABDOMEN AND PELVIS WITH CONTRAST  Technique:  Multidetector CT imaging of the abdomen and pelvis was performed following the standard protocol during bolus administration of intravenous contrast.  Contrast: OMNIPAQUE IOHEXOL 300 MG/ML  SOLN  Comparison: None.  Findings: Minor lung base atelectasis.  No pleural effusion. No acute osseous abnormality identified.  No pelvic free fluid. Negative bladder.  Negative uterus and left adnexa.  The right adnexa is in proximity to the right  lower quadrant inflammatory changes, see below.  Negative distal colon.  Negative transverse colon and hepatic flexure.  There are mild inflammatory changes where the appendix emerges from the cecum, and the appendix.  Dilated up to 9 mm.  There is evidence of an appendicolith in the more distal appendix (series 2 image 61).  The tip of the appendix appears to a ruptured with trace extraluminal gas and fluid (coronal images 59, 62) and surrounding inflammatory stranding.  The terminal ileum and right adnexa are inseparable from a portion of the appendix (series 2 image 63).  The cecum otherwise is not inflamed.  Oral contrast has not yet reached the terminal ileum.  No dilated small bowel loops.  No free intraperitoneal air.  Negative stomach, duodenum, gallbladder, spleen, pancreas, adrenal glands, kidneys and portal venous system. Major arterial structures are normal except for mild atherosclerosis.  Diffuse liver low density in keeping with steatosis.  Mild fatty sparing in the gallbladder fossa.  No abdominal free fluid.  IMPRESSION: 1.  Acute appendicitis with a small contained perforation at the appendiceal tip. Appendicolith in the midportion of the appendix. 2.  No abscess.  The terminal ileum and right adnexa are inseparable from the inflammatory change. 3.  Hepatic steatosis.  Message left for Dr. Merla Riches at 2130 hrs on 03/15/2012.  The patient is being held in the CT area at [facility].  Additional attempts will be made to  reach Dr. Merla Riches, but regardless the patient will be transferred shortly to the [facility] ED for further evaluation and care.  Original Report Authenticated By: Harley Hallmark, M.D.    Medications:    . citalopram  40 mg Oral Daily  . ertapenem  1 g Intravenous QHS  . hydrochlorothiazide  25 mg Oral Daily  . lisinopril  20 mg Oral Daily    Assessment/Plan Acute appendicitis with perforation s/p Laparoscopic appendectomy 03/16/12 Dr. Gerrit Friends.  Hypertension  Depression   Plan:  Home  on 5 more days of antibiotics.      LOS: 2 days    Schelly Chuba 03/17/2012

## 2012-03-18 ENCOUNTER — Encounter (HOSPITAL_COMMUNITY): Payer: Self-pay | Admitting: Surgery

## 2012-03-22 ENCOUNTER — Telehealth (INDEPENDENT_AMBULATORY_CARE_PROVIDER_SITE_OTHER): Payer: Self-pay | Admitting: Surgery

## 2012-03-23 ENCOUNTER — Telehealth (INDEPENDENT_AMBULATORY_CARE_PROVIDER_SITE_OTHER): Payer: Self-pay

## 2012-03-23 NOTE — Telephone Encounter (Signed)
LMOM with date of po appt 5-23 arrive at 2:30.

## 2012-04-01 ENCOUNTER — Ambulatory Visit (INDEPENDENT_AMBULATORY_CARE_PROVIDER_SITE_OTHER): Payer: Managed Care, Other (non HMO) | Admitting: Surgery

## 2012-04-01 ENCOUNTER — Encounter (INDEPENDENT_AMBULATORY_CARE_PROVIDER_SITE_OTHER): Payer: Self-pay | Admitting: Surgery

## 2012-04-01 VITALS — BP 122/82 | HR 80 | Temp 96.8°F | Resp 16 | Ht 65.5 in | Wt 196.2 lb

## 2012-04-01 DIAGNOSIS — K3580 Unspecified acute appendicitis: Secondary | ICD-10-CM

## 2012-04-01 NOTE — Progress Notes (Signed)
Visit Diagnoses: 1. Appendicitis, acute     HISTORY: Patient returns for postoperative visit having undergone laparoscopic appendectomy approximately 2-1/2 weeks ago. Patient completed her entire course of antibiotics. She has returned to work. She denies fevers or chills. She denies any significant abdominal pain.  EXAM: Abdomen is soft nontender without distention. Wounds are well healed. No sign of herniation. No sign of infection. Mild tenderness to palpation in the right lower quadrant. No palpable masses.  IMPRESSION: Status post laparoscopic appendectomy for perforated acute appendicitis  PLAN: The patient will return to full activity with a 25 pound lifting restriction for the next 2 weeks. She will begin applying topical creams her incisions. She will return to see [place] in this office as needed.  Velora Heckler, MD, FACS General & Endocrine Surgery [facility]

## 2012-04-01 NOTE — Patient Instructions (Signed)
  COCOA BUTTER & VITAMIN E CREAM  ([employer] or other brand)  Apply cocoa butter/vitamin E cream to your incision 2 - 3 times daily.  Massage cream into incision for one minute with each application.  Use sunscreen (50 SPF or higher) for first 6 months after surgery if area is exposed to sun.  You may substitute Mederma or other scar reducing creams as desired.   

## 2012-04-27 ENCOUNTER — Ambulatory Visit (INDEPENDENT_AMBULATORY_CARE_PROVIDER_SITE_OTHER): Payer: Managed Care, Other (non HMO) | Admitting: Internal Medicine

## 2012-04-27 VITALS — BP 116/80 | HR 74 | Temp 98.0°F | Resp 18 | Ht 66.0 in | Wt 199.0 lb

## 2012-04-27 DIAGNOSIS — Z6828 Body mass index (BMI) 28.0-28.9, adult: Secondary | ICD-10-CM | POA: Insufficient documentation

## 2012-04-27 DIAGNOSIS — I10 Essential (primary) hypertension: Secondary | ICD-10-CM | POA: Insufficient documentation

## 2012-04-27 DIAGNOSIS — E785 Hyperlipidemia, unspecified: Secondary | ICD-10-CM | POA: Insufficient documentation

## 2012-04-27 DIAGNOSIS — Z6832 Body mass index (BMI) 32.0-32.9, adult: Secondary | ICD-10-CM

## 2012-04-27 DIAGNOSIS — Z0000 Encounter for general adult medical examination without abnormal findings: Secondary | ICD-10-CM

## 2012-04-27 DIAGNOSIS — F418 Other specified anxiety disorders: Secondary | ICD-10-CM

## 2012-04-27 LAB — COMPREHENSIVE METABOLIC PANEL WITH GFR
ALT: 21 U/L (ref 0–35)
AST: 15 U/L (ref 0–37)
Albumin: 4.9 g/dL (ref 3.5–5.2)
Alkaline Phosphatase: 49 U/L (ref 39–117)
BUN: 23 mg/dL (ref 6–23)
CO2: 25 meq/L (ref 19–32)
Calcium: 10.2 mg/dL (ref 8.4–10.5)
Chloride: 102 meq/L (ref 96–112)
Creat: 0.97 mg/dL (ref 0.50–1.10)
Glucose, Bld: 122 mg/dL — ABNORMAL HIGH (ref 70–99)
Potassium: 4.1 meq/L (ref 3.5–5.3)
Sodium: 136 meq/L (ref 135–145)
Total Bilirubin: 0.3 mg/dL (ref 0.3–1.2)
Total Protein: 7.6 g/dL (ref 6.0–8.3)

## 2012-04-27 LAB — POCT CBC
Granulocyte percent: 53 % (ref 37–80)
HCT, POC: 40.9 % (ref 37.7–47.9)
Hemoglobin: 13.1 g/dL (ref 12.2–16.2)
Lymph, poc: 2.6 (ref 0.6–3.4)
MCH, POC: 29.8 pg (ref 27–31.2)
MCHC: 32 g/dL (ref 31.8–35.4)
MCV: 93.1 fL (ref 80–97)
MID (cbc): 0.6 (ref 0–0.9)
MPV: 8 fL (ref 0–99.8)
POC Granulocyte: 3.6 (ref 2–6.9)
POC LYMPH PERCENT: 38.5 % (ref 10–50)
POC MID %: 8.5 % (ref 0–12)
Platelet Count, POC: 384 10*3/uL (ref 142–424)
RBC: 4.39 M/uL (ref 4.04–5.48)
RDW, POC: 13.5 %
WBC: 6.7 10*3/uL (ref 4.6–10.2)

## 2012-04-27 LAB — LIPID PANEL
Cholesterol: 300 mg/dL — ABNORMAL HIGH (ref 0–200)
HDL: 55 mg/dL
LDL Cholesterol: 206 mg/dL — ABNORMAL HIGH (ref 0–99)
Total CHOL/HDL Ratio: 5.5 ratio
Triglycerides: 197 mg/dL — ABNORMAL HIGH
VLDL: 39 mg/dL (ref 0–40)

## 2012-04-27 LAB — TSH: TSH: 0.771 u[IU]/mL (ref 0.350–4.500)

## 2012-04-27 MED ORDER — LISINOPRIL-HYDROCHLOROTHIAZIDE 20-25 MG PO TABS: 1.0000 | ORAL_TABLET | Freq: Every day | ORAL | Status: DC

## 2012-04-27 MED ORDER — CITALOPRAM HYDROBROMIDE 40 MG PO TABS: 40.0000 mg | ORAL_TABLET | Freq: Every day | ORAL | Status: DC

## 2012-04-27 NOTE — Progress Notes (Addendum)
Subjective:    Patient ID: Candice Mcbride, female    DOB: [birth year], 51 y.o.   MRN: [id number]  HPIAnnual physical C. 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 She appears well-developed and well-nourished.       BMI 32  HENT:  Head: Normocephalic and atraumatic.  Right Ear: External ear normal.  Left Ear: External ear normal.  Nose: Nose normal.  Mouth/Throat: Oropharynx is clear and moist.  Eyes: Conjunctivae and EOM are normal. Pupils are equal, round, and reactive to light.  Neck: Normal range of motion. Neck supple. No thyromegaly present.  Cardiovascular: Normal rate, regular rhythm, normal heart sounds and intact distal pulses.   No murmur heard. Pulmonary/Chest: Effort normal and breath sounds normal. She has no wheezes. Right breast exhibits no inverted nipple and no mass. Left breast exhibits no inverted nipple and no mass. Breasts are symmetrical.  Abdominal: Soft. Bowel sounds are normal. She exhibits no distension and no mass.  Genitourinary: Vagina normal and uterus normal.       No rectal masses/brown stool  Musculoskeletal: Normal range of motion. She exhibits no edema and no tenderness.  Lymphadenopathy:    She has  no cervical adenopathy.  Neurological: She is alert and oriented to person, place, and time. She has normal reflexes. No cranial nerve deficit.  Skin: No rash noted.  Psychiatric: She has a normal mood and affect. Her behavior is normal. Judgment and thought content normal.     Results for orders placed in visit on 04/27/12  POCT CBC      Component Value Range   WBC 6.7  4.6 - 10.2 K/uL   Lymph, poc 2.6  0.6 - 3.4   POC LYMPH PERCENT 38.5  10 - 50 %L   MID (cbc) 0.6  0 - 0.9   POC MID % 8.5  0 - 12 %M   POC Granulocyte 3.6  2 - 6.9   Granulocyte percent 53.0  37 - 80 %G   RBC 4.39  4.04 - 5.48 M/uL   Hemoglobin 13.1  12.2 - 16.2 g/dL   HCT, POC 40.9  81.1 - 47.9 %   MCV 93.1  80 - 97 fL   MCH, POC 29.8  27 - 31.2 pg   MCHC 32.0  31.8 - 35.4 g/dL   RDW, POC 91.4     Platelet Count, POC 384  142 - 424 K/uL   MPV 8.0  0 - 99.8 fL  IFOBT (OCCULT BLOOD)      Component Value Range   IFOBT Positive          Assessment & Plan:  Problem #1 annual physical exams Problem #2 hypertension Problem #3 hyperlipidemia Problem #4 BMI 34 Problem #5 positive Hemoccult-We'll set up colonoscopy  Meds ordered this encounter  Medications  . lisinopril-hydrochlorothiazide (PRINZIDE,ZESTORETIC) 20-25 MG per tablet    Sig:  Take 1 tablet by mouth daily.    Dispense:  90 tablet    Refill:  3  . citalopram (CELEXA) 40 MG tablet    Sig: Take 1 tablet (40 mg total) by mouth daily.    Dispense:  90 tablet    Refill:  3   After her lab results will decide on the appropriate cholesterol medication Discussed serious exercise for weight loss program  Addendum-6/19=LDL 200 and fasting glucose too high Lipitor 80 /weight loss/recheck with hemoglobin A1c and lipids in 3 months

## 2012-04-28 ENCOUNTER — Encounter: Payer: Self-pay | Admitting: Internal Medicine

## 2012-04-28 MED ORDER — ATORVASTATIN CALCIUM 80 MG PO TABS: 80.0000 mg | ORAL_TABLET | Freq: Every day | ORAL | Status: DC

## 2012-04-28 NOTE — Addendum Note (Signed)
Addended by: Tonye Pearson on: 04/28/2012 10:44 AM   Modules accepted: Orders

## 2012-04-30 LAB — PAP IG (IMAGE GUIDED)

## 2013-04-11 ENCOUNTER — Other Ambulatory Visit: Payer: Self-pay | Admitting: Internal Medicine

## 2013-04-24 ENCOUNTER — Ambulatory Visit (INDEPENDENT_AMBULATORY_CARE_PROVIDER_SITE_OTHER): Payer: BC Managed Care – PPO | Admitting: Internal Medicine

## 2013-04-24 VITALS — BP 129/76 | HR 62 | Temp 98.2°F | Resp 16 | Ht 65.25 in | Wt 174.4 lb

## 2013-04-24 DIAGNOSIS — E162 Hypoglycemia, unspecified: Secondary | ICD-10-CM

## 2013-04-24 DIAGNOSIS — F418 Other specified anxiety disorders: Secondary | ICD-10-CM

## 2013-04-24 DIAGNOSIS — Z6828 Body mass index (BMI) 28.0-28.9, adult: Secondary | ICD-10-CM

## 2013-04-24 DIAGNOSIS — E785 Hyperlipidemia, unspecified: Secondary | ICD-10-CM

## 2013-04-24 DIAGNOSIS — I10 Essential (primary) hypertension: Secondary | ICD-10-CM

## 2013-04-24 DIAGNOSIS — Z0000 Encounter for general adult medical examination without abnormal findings: Secondary | ICD-10-CM

## 2013-04-24 LAB — POCT CBC
HCT, POC: 45.5 % (ref 37.7–47.9)
Hemoglobin: 14.5 g/dL (ref 12.2–16.2)
Lymph, poc: 2.3 (ref 0.6–3.4)
MCH, POC: 31.5 pg — AB (ref 27–31.2)
MCHC: 31.9 g/dL (ref 31.8–35.4)
POC Granulocyte: 4.3 (ref 2–6.9)
POC LYMPH PERCENT: 32.4 %L (ref 10–50)
POC MID %: 7.3 %M (ref 0–12)
RDW, POC: 13.6 %
WBC: 7.1 10*3/uL (ref 4.6–10.2)

## 2013-04-24 LAB — COMPREHENSIVE METABOLIC PANEL
BUN: 17 mg/dL (ref 6–23)
CO2: 23 mEq/L (ref 19–32)
Calcium: 10.2 mg/dL (ref 8.4–10.5)
Chloride: 102 mEq/L (ref 96–112)
Creat: 0.87 mg/dL (ref 0.50–1.10)
Total Bilirubin: 0.5 mg/dL (ref 0.3–1.2)

## 2013-04-24 LAB — LIPID PANEL
Cholesterol: 202 mg/dL — ABNORMAL HIGH (ref 0–200)
HDL: 60 mg/dL (ref >39)
Total CHOL/HDL Ratio: 3.4 Ratio
Triglycerides: 68 mg/dL (ref <150)
VLDL: 14 mg/dL (ref 0–40)

## 2013-04-24 MED ORDER — LISINOPRIL-HYDROCHLOROTHIAZIDE 20-12.5 MG PO TABS: 1.0000 | ORAL_TABLET | Freq: Every day | ORAL | Status: DC

## 2013-04-24 MED ORDER — CITALOPRAM HYDROBROMIDE 40 MG PO TABS: 40.0000 mg | ORAL_TABLET | Freq: Every day | ORAL | Status: DC

## 2013-04-25 ENCOUNTER — Encounter: Payer: Self-pay | Admitting: Internal Medicine

## 2013-04-25 MED ORDER — ATORVASTATIN CALCIUM 80 MG PO TABS: 80.0000 mg | ORAL_TABLET | Freq: Every day | ORAL | Status: DC

## 2013-04-25 NOTE — Progress Notes (Signed)
Subjective:    Patient ID: Candice Mcbride, female    DOB: October 11, 1961, 52 y.o.   MRN: [id number]  HPI annual exam  problem #1 elevated BMI--- has lost 25 pounds since January her diet and exercise Problem #2 hyperlipidemia-remains on Lipitor Problem #3 hypertension-remains on lisinopril/hct20/25 Problem #4 history of depression with anxiety-stable on Celexa   New job-[employer]  Past medical history and social history otherwise unchanged Mammograms annually normal Pap smear last year within normal limits   Review of Systems 13 port review of systems otherwise negative     Objective:   Physical Exam BP 129/76  Pulse 62  Temp(Src) 98.2 F (36.8 C) (Oral)  Resp 16  Ht 5' 5.25" (1.657 m)  Wt 174 lb 6.4 oz (79.107 kg)  BMI 28.81 kg/m2  SpO2 98% HEENT clear Heart regular without murmur Lungs clear Breasts without masses Abdomen supple Extremities clear Neurological intact Psychological stable       Assessment & Plan:Annual exam Annual physical exam - Plan: POCT CBC, POCT glycosylated hemoglobin (Hb A1C), Comprehensive metabolic panel, Lipid panel  Other and unspecified hyperlipidemia  Hypoglycemia  Unspecified essential hypertension - Plan: lisinopril-hydrochlorothiazide (PRINZIDE,ZESTORETIC) 20-12.5 MG per tablet  Depression with anxiety  BMI 28.0-28.9,adult    Annual physical exam - Plan: POCT CBC, POCT glycosylated hemoglobin (Hb A1C), Comprehensive metabolic panel, Lipid panel  Other and unspecified hyperlipidemia-will cont lip 80  Hyperglycemia at last labs-A1C  Unspecified essential hypertension - Plan:decrease HCT componenet-- lisinopril-hydrochlorothiazide (PRINZIDE,ZESTORETIC) 20-12.5 MG per tablet # 90 3ref  Depression with anxiety  BMI 28.0-28.9,adult--- has reduced BMI fr 32 to 28 and will continue with her successful program    Results for orders placed in visit on 04/24/13  COMPREHENSIVE METABOLIC PANEL      Result Value Range    Sodium 137  135 - 145 mEq/L   Potassium 4.2  3.5 - 5.3 mEq/L   Chloride 102  96 - 112 mEq/L   CO2 23  19 - 32 mEq/L   Glucose, Bld 102 (*) 70 - 99 mg/dL   BUN 17  6 - 23 mg/dL   Creat 1.91  4.78 - 2.95 mg/dL   Total Bilirubin 0.5  0.3 - 1.2 mg/dL   Alkaline Phosphatase 57  39 - 117 U/L   AST 17  0 - 37 U/L   ALT 22  0 - 35 U/L   Total Protein 7.6  6.0 - 8.3 g/dL   Albumin 5.0  3.5 - 5.2 g/dL   Calcium 62.1  8.4 - 30.8 mg/dL  LIPID PANEL      Result Value Range   Cholesterol 202 (*) 0 - 200 mg/dL   Triglycerides 68  <657 mg/dL   HDL 60  >84 mg/dL   Total CHOL/HDL Ratio 3.4     VLDL 14  0 - 40 mg/dL   LDL Cholesterol 696 (*) 0 - 99 mg/dL  POCT CBC      Result Value Range   WBC 7.1  4.6 - 10.2 K/uL   Lymph, poc 2.3  0.6 - 3.4   POC LYMPH PERCENT 32.4  10 - 50 %L   MID (cbc) 0.5  0 - 0.9   POC MID % 7.3  0 - 12 %M   POC Granulocyte 4.3  2 - 6.9   Granulocyte percent 60.3  37 - 80 %G   RBC 4.61  4.04 - 5.48 M/uL   Hemoglobin 14.5  12.2 - 16.2 g/dL   HCT, POC 45.5  37.7 - 47.9 %   MCV 98.7 (*) 80 - 97 fL   MCH, POC 31.5 (*) 27 - 31.2 pg   MCHC 31.9  31.8 - 35.4 g/dL   RDW, POC 78.2     Platelet Count, POC 360  142 - 424 K/uL   MPV 8.0  0 - 99.8 fL  POCT GLYCOSYLATED HEMOGLOBIN (HGB A1C)      Result Value Range   Hemoglobin A1C 5.8

## 2013-04-25 NOTE — Addendum Note (Signed)
Addended by: Tonye Pearson on: 04/25/2013 11:09 AM   Modules accepted: Orders

## 2014-05-10 ENCOUNTER — Other Ambulatory Visit: Payer: Self-pay | Admitting: Internal Medicine

## 2014-05-22 ENCOUNTER — Other Ambulatory Visit: Payer: Self-pay | Admitting: Internal Medicine

## 2014-06-11 ENCOUNTER — Other Ambulatory Visit: Payer: Self-pay | Admitting: Internal Medicine

## 2014-06-13 ENCOUNTER — Other Ambulatory Visit: Payer: Self-pay | Admitting: Internal Medicine

## 2014-06-14 ENCOUNTER — Other Ambulatory Visit: Payer: Self-pay | Admitting: Internal Medicine

## 2014-06-18 ENCOUNTER — Ambulatory Visit (INDEPENDENT_AMBULATORY_CARE_PROVIDER_SITE_OTHER): Payer: BC Managed Care – PPO | Admitting: Emergency Medicine

## 2014-06-18 VITALS — BP 160/90 | HR 62 | Temp 97.5°F | Resp 18 | Wt 191.0 lb

## 2014-06-18 DIAGNOSIS — E785 Hyperlipidemia, unspecified: Secondary | ICD-10-CM

## 2014-06-18 DIAGNOSIS — I10 Essential (primary) hypertension: Secondary | ICD-10-CM

## 2014-06-18 LAB — POCT UA - MICROSCOPIC ONLY
CASTS, UR, LPF, POC: NEGATIVE
CRYSTALS, UR, HPF, POC: NEGATIVE
Mucus, UA: NEGATIVE
Yeast, UA: NEGATIVE

## 2014-06-18 LAB — POCT URINALYSIS DIPSTICK
Bilirubin, UA: NEGATIVE
Glucose, UA: NEGATIVE
Ketones, UA: NEGATIVE
LEUKOCYTES UA: NEGATIVE
Nitrite, UA: NEGATIVE
PH UA: 5.5
PROTEIN UA: NEGATIVE
SPEC GRAV UA: 1.01
UROBILINOGEN UA: 0.2

## 2014-06-18 LAB — POCT CBC
Granulocyte percent: 58.2 %G (ref 37–80)
HCT, POC: 40 % (ref 37.7–47.9)
Hemoglobin: 13.2 g/dL (ref 12.2–16.2)
LYMPH, POC: 2.1 (ref 0.6–3.4)
MCH: 31.8 pg — AB (ref 27–31.2)
MCHC: 32.9 g/dL (ref 31.8–35.4)
MCV: 96.7 fL (ref 80–97)
MID (CBC): 0.4 (ref 0–0.9)
MPV: 6.8 fL (ref 0–99.8)
PLATELET COUNT, POC: 300 10*3/uL (ref 142–424)
POC Granulocyte: 3.4 (ref 2–6.9)
POC LYMPH %: 35.4 % (ref 10–50)
POC MID %: 6.4 %M (ref 0–12)
RBC: 4.14 M/uL (ref 4.04–5.48)
RDW, POC: 13.5 %
WBC: 5.9 10*3/uL (ref 4.6–10.2)

## 2014-06-18 LAB — COMPREHENSIVE METABOLIC PANEL
ALK PHOS: 47 U/L (ref 39–117)
ALT: 16 U/L (ref 0–35)
AST: 15 U/L (ref 0–37)
Albumin: 4.5 g/dL (ref 3.5–5.2)
BILIRUBIN TOTAL: 0.4 mg/dL (ref 0.2–1.2)
BUN: 15 mg/dL (ref 6–23)
CO2: 24 mEq/L (ref 19–32)
CREATININE: 0.82 mg/dL (ref 0.50–1.10)
Calcium: 9.4 mg/dL (ref 8.4–10.5)
Chloride: 106 mEq/L (ref 96–112)
GLUCOSE: 105 mg/dL — AB (ref 70–99)
Potassium: 3.9 mEq/L (ref 3.5–5.3)
SODIUM: 139 meq/L (ref 135–145)
TOTAL PROTEIN: 6.8 g/dL (ref 6.0–8.3)

## 2014-06-18 LAB — LIPID PANEL
CHOL/HDL RATIO: 5.1 ratio
CHOLESTEROL: 289 mg/dL — AB (ref 0–200)
HDL: 57 mg/dL (ref >39)
LDL CALC: 189 mg/dL — AB (ref 0–99)
TRIGLYCERIDES: 214 mg/dL — AB (ref <150)
VLDL: 43 mg/dL — AB (ref 0–40)

## 2014-06-18 MED ORDER — LISINOPRIL-HYDROCHLOROTHIAZIDE 20-12.5 MG PO TABS: 1.0000 | ORAL_TABLET | Freq: Every day | ORAL | Status: DC

## 2014-06-18 MED ORDER — ATORVASTATIN CALCIUM 80 MG PO TABS: 80.0000 mg | ORAL_TABLET | Freq: Every day | ORAL | Status: DC

## 2014-06-18 MED ORDER — CITALOPRAM HYDROBROMIDE 40 MG PO TABS: ORAL_TABLET | ORAL | Status: DC

## 2014-06-18 NOTE — Progress Notes (Signed)
   Subjective:    Patient ID: Candice Mcbride, female    DOB: 03/20/1961, 53 y.o.   MRN: [id number]  HPI  History of hypertension.  For physical.  Needs colonoscopy.  Denies other complaint or health concern today. .  Review of Systems  Constitutional: Negative.   Eyes: Negative.   Respiratory: Negative.   Cardiovascular: Negative.   Gastrointestinal: Negative.   Endocrine: Negative.   Genitourinary: Negative.   Musculoskeletal: Negative.   Skin: Negative.   Allergic/Immunologic: Negative.   Neurological: Positive for dizziness and headaches.       Ran out of blood pressure medications/ states this has caused headache  Hematological: Negative.   Psychiatric/Behavioral: Negative.        Objective:   Physical Exam  GEN: WDWN, NAD, Non-toxic, A & O x 3 HEENT: Atraumatic, Normocephalic. Neck supple. No masses, No LAD. Ears and Nose: No external deformity. CV: RRR, No M/G/R. No JVD. No thrill. No extra heart sounds. PULM: CTA B, no wheezes, crackles, rhonchi. No retractions. No resp. distress. No accessory muscle use. ABD: S, NT, ND, +BS. No rebound. No HSM. EXTR: No c/c/e NEURO Normal gait.  PSYCH: Normally interactive. Conversant. Not depressed or anxious appearing.  Calm demeanor.        Assessment & Plan:   Past due for colonoscopy GI consult Hypertension  Labs pending.

## 2014-06-18 NOTE — Patient Instructions (Signed)

## 2014-06-19 ENCOUNTER — Other Ambulatory Visit: Payer: Self-pay | Admitting: Emergency Medicine

## 2014-06-19 DIAGNOSIS — Z1231 Encounter for screening mammogram for malignant neoplasm of breast: Secondary | ICD-10-CM

## 2014-07-21 ENCOUNTER — Encounter: Payer: Self-pay | Admitting: Emergency Medicine

## 2014-09-05 ENCOUNTER — Encounter: Payer: Self-pay | Admitting: Internal Medicine

## 2014-09-10 ENCOUNTER — Ambulatory Visit (INDEPENDENT_AMBULATORY_CARE_PROVIDER_SITE_OTHER): Payer: BC Managed Care – PPO | Admitting: Emergency Medicine

## 2014-09-10 VITALS — BP 148/78 | HR 109 | Temp 98.1°F | Resp 16 | Ht 65.25 in | Wt 192.0 lb

## 2014-09-10 DIAGNOSIS — N3000 Acute cystitis without hematuria: Secondary | ICD-10-CM

## 2014-09-10 DIAGNOSIS — R339 Retention of urine, unspecified: Secondary | ICD-10-CM

## 2014-09-10 LAB — POCT URINALYSIS DIPSTICK
Bilirubin, UA: NEGATIVE
GLUCOSE UA: NEGATIVE
Ketones, UA: NEGATIVE
LEUKOCYTES UA: NEGATIVE
NITRITE UA: NEGATIVE
PROTEIN UA: NEGATIVE
Spec Grav, UA: <=1.005
UROBILINOGEN UA: 0.2
pH, UA: 5.5

## 2014-09-10 LAB — POCT UA - MICROSCOPIC ONLY
CASTS, UR, LPF, POC: NEGATIVE
CRYSTALS, UR, HPF, POC: NEGATIVE
Mucus, UA: NEGATIVE
YEAST UA: NEGATIVE

## 2014-09-10 MED ORDER — PHENAZOPYRIDINE HCL 200 MG PO TABS: 200.0000 mg | ORAL_TABLET | Freq: Three times a day (TID) | ORAL | Status: DC | PRN

## 2014-09-10 MED ORDER — CIPROFLOXACIN HCL 500 MG PO TABS: 500.0000 mg | ORAL_TABLET | Freq: Two times a day (BID) | ORAL | Status: DC

## 2014-09-10 NOTE — Patient Instructions (Signed)

## 2014-09-10 NOTE — Progress Notes (Signed)
[facility] [address], [place] [id number] [phone]  Date:  09/10/2014   Name:  Candice RoyalSusan J Schoon   DOB:  1961-01-09   MRN:  [id number]  PCP:  Tonye PearsonOLITTLE, ROBERT P, MD    Chief Complaint: Urinary Retention and Abdominal Pain   History of Present Illness:  Candice Mcbride is a 53 y.o. very pleasant female patient who presents with the following:  Dysuria, urgency and frequency Friday and is worse now.  No gyn symptoms No fever or chills. No nausea or vomiting.  No stool change.  No rash Some low back pain.  No abdominal pain. No improvement with over the counter medications or other home remedies.  Denies other complaint or health concern today.   Patient Active Problem List   Diagnosis Date Noted  . Other and unspecified hyperlipidemia 04/27/2012  . Unspecified essential hypertension 04/27/2012  . Depression with anxiety 04/27/2012  . BMI 28.0-28.9,adult 04/27/2012  . Appendicitis, acute 03/15/2012    Past Medical History  Diagnosis Date  . Hypertension   . Depression     Past Surgical History  Procedure Laterality Date  . Cesarean section    . Tonsillectomy and adenoidectomy    . Laparoscopic appendectomy  03/16/2012    Procedure: APPENDECTOMY LAPAROSCOPIC;  Surgeon: Velora Hecklerodd M Gerkin, MD;  Location: [facility];  Service: General;  Laterality: N/A;  . Appendectomy    . Tubal ligation      History  Substance Use Topics  . Smoking status: Former Games developermoker  . Smokeless tobacco: Not on file  . Alcohol Use: Yes     Comment: occassional    Family History  Problem Relation Age of Onset  . Heart attack Father   . Diabetes Father   . Heart disease Father   . Hypertension Other   . Diabetes Other   . Diverticulitis Mother   . Hypertension Mother   . Diabetes Sister   . Hypertension Sister     Allergies  Allergen Reactions  . Codeine     Headache   . Morphine And Related     Headache     Medication list has been reviewed and  updated.  Current Outpatient Prescriptions on File Prior to Visit  Medication Sig Dispense Refill  . atorvastatin (LIPITOR) 80 MG tablet Take 1 tablet (80 mg total) by mouth daily. 90 tablet 3  . citalopram (CELEXA) 40 MG tablet TAKE ONE TABLET BY MOUTH ONCE DAILY 90 tablet 3  . ibuprofen (ADVIL) 200 MG tablet 2-3 tablets as needed every 6 hours.  Or you can use plain tylenol as directed. 30 tablet 0  . lisinopril-hydrochlorothiazide (PRINZIDE,ZESTORETIC) 20-12.5 MG per tablet Take 1 tablet by mouth daily. PATIENT NEEDS OFFICE VISIT FOR ADDITIONAL REFILLS 90 tablet 1   No current facility-administered medications on file prior to visit.    Review of Systems:  As per HPI, otherwise negative.    Physical Examination: Filed Vitals:   09/10/14 1623  BP: 148/78  Pulse: 109  Temp: 98.1 F (36.7 C)  Resp: 16   Filed Vitals:   09/10/14 1623  Height: 5' 5.25" (1.657 m)  Weight: 192 lb (87.091 kg)   Body mass index is 31.72 kg/(m^2). Ideal Body Weight: Weight in (lb) to have BMI = 25: 151.1   GEN: WDWN, NAD, Non-toxic, Alert & Oriented x 3 HEENT: Atraumatic, Normocephalic.  Ears and Nose: No external deformity. EXTR: No clubbing/cyanosis/edema NEURO: Normal gait.  PSYCH: Normally interactive. Conversant. Not  depressed or anxious appearing.  Calm demeanor.  Back:  No CVA tenderness Abd: obese and soft. Not tender  Assessment and Plan: Cystitis cipro Pyridium  Signed,  Phillips OdorJeffery Malyah Ohlrich, MD   Results for orders placed or performed in visit on 09/10/14  POCT UA - Microscopic Only  Result Value Ref Range   WBC, Ur, HPF, POC 5-8    RBC, urine, microscopic 0-2    Bacteria, U Microscopic trace    Mucus, UA neg    Epithelial cells, urine per micros 0-2    Crystals, Ur, HPF, POC neg    Casts, Ur, LPF, POC neg    Yeast, UA neg   POCT urinalysis dipstick  Result Value Ref Range   Color, UA light yellow    Clarity, UA clear    Glucose, UA neg    Bilirubin, UA neg     Ketones, UA neg    Spec Grav, UA <=1.005    Blood, UA large    pH, UA 5.5    Protein, UA neg    Urobilinogen, UA 0.2    Nitrite, UA neg    Leukocytes, UA Negative

## 2014-11-21 ENCOUNTER — Ambulatory Visit (INDEPENDENT_AMBULATORY_CARE_PROVIDER_SITE_OTHER): Payer: BLUE CROSS/BLUE SHIELD | Admitting: Physician Assistant

## 2014-11-21 VITALS — BP 136/80 | HR 90 | Temp 98.2°F | Resp 18 | Ht 66.0 in | Wt 190.0 lb

## 2014-11-21 DIAGNOSIS — J069 Acute upper respiratory infection, unspecified: Secondary | ICD-10-CM

## 2014-11-21 DIAGNOSIS — B9789 Other viral agents as the cause of diseases classified elsewhere: Principal | ICD-10-CM

## 2014-11-21 MED ORDER — IPRATROPIUM BROMIDE 0.03 % NA SOLN: 2.0000 | Freq: Two times a day (BID) | NASAL | Status: DC

## 2014-11-21 MED ORDER — MAGIC MOUTHWASH W/LIDOCAINE: 10.0000 mL | ORAL | Status: DC | PRN

## 2014-11-21 MED ORDER — AMOXICILLIN 875 MG PO TABS: 875.0000 mg | ORAL_TABLET | Freq: Two times a day (BID) | ORAL | Status: AC

## 2014-11-21 MED ORDER — ALBUTEROL SULFATE HFA 108 (90 BASE) MCG/ACT IN AERS: 2.0000 | INHALATION_SPRAY | RESPIRATORY_TRACT | Status: DC | PRN

## 2014-11-21 NOTE — Patient Instructions (Signed)
Continue with home mucinex and nyquil for sleep. Stop dayquil. Start using nasal spray twice a day. May gargle mouthwash every 2-3 hours for throat pain. May fill antibiotic if symptoms are not improving in 3 days. Return in 7-10 days if no improvement.

## 2014-11-21 NOTE — Progress Notes (Signed)
Subjective:    Patient ID: Candice Mcbride, female    DOB: Oct 26, 1961, 54 y.o.   MRN: [id number]  HPI  This is a 54 year old female with PMH HTN, HLD and depression who is presenting with sore throat, cough and nasal congestion x 5 days. She reports she feels "drained". She states she feels feverish but has not checked her temperature. She is having some ear pressure. She denies wheezing, SOB, sinus pressure or otalgia. She had a cough that started 1 month ago but this improved. She was still having residual cough. 5 days ago cough increased again. It was dry but became productive. The cough is keeping her up at night. She is taking dayquil, nyquil and mucinex with some relief. She does not have a history of asthma and is not a smoker.   Review of Systems  Constitutional: Positive for fever and fatigue. Negative for chills.  HENT: Positive for congestion and sore throat. Negative for ear pain and sinus pressure.   Respiratory: Positive for cough. Negative for shortness of breath and wheezing.   Gastrointestinal: Negative for nausea, vomiting and diarrhea.  Skin: Negative for rash.  Hematological: Negative for adenopathy.  Psychiatric/Behavioral: Positive for sleep disturbance.   Patient Active Problem List   Diagnosis Date Noted  . Other and unspecified hyperlipidemia 04/27/2012  . Unspecified essential hypertension 04/27/2012  . Depression with anxiety 04/27/2012  . BMI 28.0-28.9,adult 04/27/2012  . Appendicitis, acute 03/15/2012   Prior to Admission medications   Medication Sig Start Date End Date Taking? Authorizing Provider  atorvastatin (LIPITOR) 80 MG tablet Take 1 tablet (80 mg total) by mouth daily. 06/18/14 06/18/15 Yes Carmelina Dane, MD  citalopram (CELEXA) 40 MG tablet TAKE ONE TABLET BY MOUTH ONCE DAILY 06/18/14  Yes Carmelina Dane, MD  lisinopril-hydrochlorothiazide (PRINZIDE,ZESTORETIC) 20-12.5 MG per tablet Take 1 tablet by mouth daily. PATIENT NEEDS OFFICE VISIT FOR  ADDITIONAL REFILLS 06/18/14  Yes Carmelina Dane, MD   Allergies  Allergen Reactions  . Codeine     Headache   . Morphine And Related     Headache    Patient's social and family history were reviewed.     Objective:   Physical Exam  Constitutional: She is oriented to person, place, and time. She appears well-developed and well-nourished. No distress.  HENT:  Head: Normocephalic and atraumatic.  Right Ear: Hearing, external ear and ear canal normal.  Left Ear: Hearing, external ear and ear canal normal.  Nose: Right sinus exhibits maxillary sinus tenderness. Right sinus exhibits no frontal sinus tenderness. Left sinus exhibits maxillary sinus tenderness. Left sinus exhibits no frontal sinus tenderness.  Mouth/Throat: Uvula is midline and mucous membranes are normal. Posterior oropharyngeal erythema present. No oropharyngeal exudate or posterior oropharyngeal edema.  Bilateral TMs dull  Eyes: Conjunctivae and lids are normal. Right eye exhibits no discharge. Left eye exhibits no discharge. No scleral icterus.  Cardiovascular: Normal rate, regular rhythm, normal heart sounds, intact distal pulses and normal pulses.   No murmur heard. Pulmonary/Chest: Effort normal and breath sounds normal. No respiratory distress. She has no wheezes. She has no rhonchi. She has no rales.  Musculoskeletal: Normal range of motion.  Lymphadenopathy:       Head (right side): No submental, no submandibular, no tonsillar, no preauricular and no posterior auricular adenopathy present.       Head (left side): No submental, no submandibular, no tonsillar, no preauricular and no posterior auricular adenopathy present.    She has no  cervical adenopathy.  Neurological: She is alert and oriented to person, place, and time.  Skin: Skin is warm, dry and intact. No lesion and no rash noted.  Psychiatric: She has a normal mood and affect. Her speech is normal and behavior is normal. Thought content normal.   BP  136/80 mmHg  Pulse 90  Temp(Src) 98.2 F (36.8 C) (Oral)  Resp 18  Ht 5\' 6"  (1.676 m)  Wt 190 lb (86.183 kg)  BMI 30.68 kg/m2  SpO2 97%     Assessment & Plan:  1. Viral URI with cough This is likely a viral illness. Worried this may turn into sinusitis. Focus is on supportive care for now, see meds prescribed below. Gave print out of amoxicillin that she may fill in 3 days if symptoms are not improving. She will return in 7-10 days if symptoms do not improve.  - Alum & Mag Hydroxide-Simeth (MAGIC MOUTHWASH W/LIDOCAINE) SOLN; Take 10 mLs by mouth every 2 (two) hours as needed for mouth pain.  Dispense: 360 mL; Refill: 0 - ipratropium (ATROVENT) 0.03 % nasal spray; Place 2 sprays into both nostrils 2 (two) times daily.  Dispense: 30 mL; Refill: 0 - amoxicillin (AMOXIL) 875 MG tablet; Take 1 tablet (875 mg total) by mouth 2 (two) times daily.  Dispense: 20 tablet; Refill: 0 - albuterol (PROVENTIL HFA;VENTOLIN HFA) 108 (90 BASE) MCG/ACT inhaler; Inhale 2 puffs into the lungs every 4 (four) hours as needed for wheezing or shortness of breath (cough, shortness of breath or wheezing.).  Dispense: 1 Inhaler; Refill: 1   Nicole V. Dyke BrackettBush, PA-C, MHS [facility]  11/21/2014

## 2014-12-22 ENCOUNTER — Other Ambulatory Visit: Payer: Self-pay | Admitting: Emergency Medicine

## 2015-01-22 ENCOUNTER — Other Ambulatory Visit: Payer: Self-pay | Admitting: Emergency Medicine

## 2015-02-28 ENCOUNTER — Telehealth: Payer: Self-pay | Admitting: Physician Assistant

## 2015-02-28 MED ORDER — LISINOPRIL-HYDROCHLOROTHIAZIDE 20-12.5 MG PO TABS: 1.0000 | ORAL_TABLET | Freq: Every day | ORAL | Status: DC

## 2015-02-28 NOTE — Telephone Encounter (Signed)
[phone]  BP med refill - wants to know why she has to call every month for refills.

## 2015-02-28 NOTE — Telephone Encounter (Signed)
She is correct--altho I las t saw her 2014 Meds ordered this encounter  Medications  . DISCONTD: lisinopril-hydrochlorothiazide (PRINZIDE,ZESTORETIC) 20-12.5 MG per tablet    Sig: Take 1 tablet by mouth daily. NO MORE REFILLS WITHOUT BLOOD PRESSURE CHECK UP - 2ND NOTICE    Dispense:  15 tablet    Refill:  0  . lisinopril-hydrochlorothiazide (PRINZIDE,ZESTORETIC) 20-12.5 MG per tablet    Sig: Take 1 tablet by mouth daily.    Dispense:  90 tablet    Refill:  1

## 2015-02-28 NOTE — Telephone Encounter (Signed)
Spoke with pt, she states she usually gets her Rx refilled for one year. Last physical 06/2014. Can we refill until then?

## 2015-02-28 NOTE — Addendum Note (Signed)
Addended by: Tonye PearsonOLITTLE, Karilynn Carranza P on: 02/28/2015 05:08 PM   Modules accepted: Orders

## 2015-02-28 NOTE — Telephone Encounter (Signed)
I did not order her blood pressure medicine

## 2015-02-28 NOTE — Telephone Encounter (Signed)
Candice Mcbride is her PCP.

## 2015-03-01 NOTE — Telephone Encounter (Signed)
Called pt, Rx sent in.

## 2015-07-11 ENCOUNTER — Other Ambulatory Visit: Payer: Self-pay | Admitting: Emergency Medicine

## 2015-08-08 ENCOUNTER — Ambulatory Visit (INDEPENDENT_AMBULATORY_CARE_PROVIDER_SITE_OTHER): Payer: BLUE CROSS/BLUE SHIELD | Admitting: Family Medicine

## 2015-08-08 VITALS — BP 138/72 | HR 96 | Temp 97.7°F | Resp 18 | Ht 65.0 in | Wt 186.0 lb

## 2015-08-08 DIAGNOSIS — E785 Hyperlipidemia, unspecified: Secondary | ICD-10-CM | POA: Diagnosis not present

## 2015-08-08 DIAGNOSIS — F418 Other specified anxiety disorders: Secondary | ICD-10-CM

## 2015-08-08 DIAGNOSIS — I10 Essential (primary) hypertension: Secondary | ICD-10-CM | POA: Diagnosis not present

## 2015-08-08 DIAGNOSIS — Z1211 Encounter for screening for malignant neoplasm of colon: Secondary | ICD-10-CM | POA: Diagnosis not present

## 2015-08-08 DIAGNOSIS — Z1159 Encounter for screening for other viral diseases: Secondary | ICD-10-CM | POA: Diagnosis not present

## 2015-08-08 DIAGNOSIS — Z114 Encounter for screening for human immunodeficiency virus [HIV]: Secondary | ICD-10-CM | POA: Diagnosis not present

## 2015-08-08 LAB — POCT CBC
GRANULOCYTE PERCENT: 53.8 % (ref 37–80)
HCT, POC: 43.6 % (ref 37.7–47.9)
Hemoglobin: 14.1 g/dL (ref 12.2–16.2)
Lymph, poc: 3.4 (ref 0.6–3.4)
MCH, POC: 30.5 pg (ref 27–31.2)
MCHC: 32.4 g/dL (ref 31.8–35.4)
MCV: 94.1 fL (ref 80–97)
MID (CBC): 0.4 (ref 0–0.9)
MPV: 6.5 fL (ref 0–99.8)
PLATELET COUNT, POC: 382 10*3/uL (ref 142–424)
POC Granulocyte: 4.4 (ref 2–6.9)
POC LYMPH %: 41.5 % (ref 10–50)
POC MID %: 4.7 %M (ref 0–12)
RBC: 4.63 M/uL (ref 4.04–5.48)
RDW, POC: 13.1 %
WBC: 8.2 10*3/uL (ref 4.6–10.2)

## 2015-08-08 LAB — POCT GLYCOSYLATED HEMOGLOBIN (HGB A1C): HEMOGLOBIN A1C: 6.1

## 2015-08-08 MED ORDER — CITALOPRAM HYDROBROMIDE 40 MG PO TABS: ORAL_TABLET | ORAL | Status: DC

## 2015-08-08 MED ORDER — ATORVASTATIN CALCIUM 80 MG PO TABS: 80.0000 mg | ORAL_TABLET | Freq: Every day | ORAL | Status: DC

## 2015-08-08 MED ORDER — LISINOPRIL-HYDROCHLOROTHIAZIDE 20-12.5 MG PO TABS
1.0000 | ORAL_TABLET | Freq: Every day | ORAL | Status: DC
Start: 2015-08-08 — End: 2016-09-05

## 2015-08-08 NOTE — Progress Notes (Signed)
Candice Mcbride is a 54 y.o. who presents today for complete physical.  CPE - Pt with hx of HTN and HLD along with Celexa.  No changes since last visit.  Due for lab work.  Compliant with medications and denies SE including dizziness, blurred vision, palpitations, edema, fatigue, nausea.   Preventative medicine - No colonoscopy per date ( interested).  Due for Hepatitis C screening and HIV.    Past Medical History  Diagnosis Date  . Hypertension   . Depression   . Hyperlipidemia     Social History   Social History  . Marital Status: Married    Spouse Name: N/A  . Number of Children: N/A  . Years of Education: N/A   Occupational History  . Not on file.   Social History Main Topics  . Smoking status: Former Games developer  . Smokeless tobacco: Not on file  . Alcohol Use: Yes     Comment: occassional  . Drug Use: No  . Sexual Activity: Not on file   Other Topics Concern  . Not on file   Social History Narrative    Family History  Problem Relation Age of Onset  . Heart attack Father   . Diabetes Father   . Heart disease Father   . Hypertension Other   . Diabetes Other   . Diverticulitis Mother   . Hypertension Mother   . Diabetes Sister   . Hypertension Sister     Current Outpatient Prescriptions on File Prior to Visit  Medication Sig Dispense Refill  . citalopram (CELEXA) 40 MG tablet TAKE ONE TABLET BY MOUTH ONCE DAILY.  "OV NEEDED FOR REFILLS" 30 tablet 0  . lisinopril-hydrochlorothiazide (PRINZIDE,ZESTORETIC) 20-12.5 MG per tablet Take 1 tablet by mouth daily. 90 tablet 1  . albuterol (PROVENTIL HFA;VENTOLIN HFA) 108 (90 BASE) MCG/ACT inhaler Inhale 2 puffs into the lungs every 4 (four) hours as needed for wheezing or shortness of breath (cough, shortness of breath or wheezing.). 1 Inhaler 1  . Alum & Mag Hydroxide-Simeth (MAGIC MOUTHWASH W/LIDOCAINE) SOLN Take 10 mLs by mouth every 2 (two) hours as needed for mouth pain. 360 mL 0  . atorvastatin (LIPITOR) 80 MG  tablet Take 1 tablet (80 mg total) by mouth daily. 90 tablet 3  . ibuprofen (ADVIL) 200 MG tablet 2-3 tablets as needed every 6 hours.  Or you can use plain tylenol as directed. (Patient not taking: Reported on 11/21/2014) 30 tablet 0  . ipratropium (ATROVENT) 0.03 % nasal spray Place 2 sprays into both nostrils 2 (two) times daily. 30 mL 0   No current facility-administered medications on file prior to visit.    Patient Information Form: Screening and ROS  AUDIT-C Score: 2 Do you feel safe in relationships? Yes.   PHQ-2:negative  Review of Symptoms  General:  Negative for nexplained weight loss, fever Skin: Negative for new or changing mole, sore that won't heal HEENT: Negative for trouble hearing, trouble seeing, ringing in ears, mouth sores, hoarseness, change in voice, dysphagia. CV:  Negative for chest pain, dyspnea, edema, palpitations Resp: Negative for cough, dyspnea, hemoptysis GI: Negative for nausea, vomiting, diarrhea, constipation, abdominal pain, melena, hematochezia. GU: Negative for dysuria, incontinence, urinary hesitance, hematuria, vaginal or penile discharge, polyuria, sexual difficulty, lumps in testicle or breasts MSK: Negative for muscle cramps or aches, joint pain or swelling Neuro: Negative for headaches, weakness, numbness, dizziness, passing out/fainting Psych: Negative for depression, anxiety, memory problems  Physical Exam Filed Vitals:   08/08/15 1737  BP:  138/72  Pulse: 96  Temp: 97.7 F (36.5 C)  Resp: 18    Gen: NAD, Well nourished, Well developed HEENT: PERLA, EOMI, [place]/AT Neck: no JVD Cardio: RRR, No murmurs/gallops/rubs Lungs: CTA, no wheezes, rhonchi, crackles Abd: NABS, soft nontender nondistended MSK: ROM normal  Neuro: CN 2-12 intact, MS 5/5 B/L UE and LE, +2 patellar and achilles relfex b/l  Psych: AAO x 3     Chemistry      Component Value Date/Time   NA 139 06/18/2014 0902   K 3.9 06/18/2014 0902   CL 106 06/18/2014 0902    CO2 24 06/18/2014 0902   BUN 15 06/18/2014 0902   CREATININE 0.82 06/18/2014 0902   CREATININE 0.95 03/17/2012 0445      Component Value Date/Time   CALCIUM 9.4 06/18/2014 0902   ALKPHOS 47 06/18/2014 0902   AST 15 06/18/2014 0902   ALT 16 06/18/2014 0902   BILITOT 0.4 06/18/2014 0902      Lab Results  Component Value Date   WBC 5.9 06/18/2014   HGB 13.2 06/18/2014   HCT 40.0 06/18/2014   MCV 96.7 06/18/2014   PLT 271 03/17/2012   Lab Results  Component Value Date   TSH 0.771 04/27/2012   Lab Results  Component Value Date   HGBA1C 5.8 04/24/2013

## 2015-08-08 NOTE — Assessment & Plan Note (Signed)
Check Lipid Panel today Restart Lipitor

## 2015-08-08 NOTE — Assessment & Plan Note (Signed)
BP well controlled.   Due for Labs. Continue Zestoretic

## 2015-08-08 NOTE — Assessment & Plan Note (Signed)
Due for colonoscopy, referral placed.

## 2015-08-08 NOTE — Assessment & Plan Note (Signed)
Denies SI/HI.  Stable on Celexa, continue.

## 2015-08-08 NOTE — Patient Instructions (Signed)

## 2015-08-09 LAB — HIV ANTIBODY (ROUTINE TESTING W REFLEX): HIV: NONREACTIVE

## 2015-08-09 LAB — COMPREHENSIVE METABOLIC PANEL
ALK PHOS: 57 U/L (ref 33–130)
ALT: 25 U/L (ref 6–29)
AST: 21 U/L (ref 10–35)
Albumin: 5 g/dL (ref 3.6–5.1)
BILIRUBIN TOTAL: 0.5 mg/dL (ref 0.2–1.2)
BUN: 13 mg/dL (ref 7–25)
CO2: 26 mmol/L (ref 20–31)
CREATININE: 0.88 mg/dL (ref 0.50–1.05)
Calcium: 10.1 mg/dL (ref 8.6–10.4)
Chloride: 99 mmol/L (ref 98–110)
GLUCOSE: 84 mg/dL (ref 65–99)
Potassium: 3.9 mmol/L (ref 3.5–5.3)
SODIUM: 138 mmol/L (ref 135–146)
TOTAL PROTEIN: 8 g/dL (ref 6.1–8.1)

## 2015-08-09 LAB — LIPID PANEL
Cholesterol: 302 mg/dL — ABNORMAL HIGH (ref 125–200)
HDL: 56 mg/dL (ref >=46)
LDL CALC: 218 mg/dL — AB (ref <130)
Total CHOL/HDL Ratio: 5.4 Ratio — ABNORMAL HIGH (ref <=5.0)
Triglycerides: 140 mg/dL (ref <150)
VLDL: 28 mg/dL (ref <30)

## 2015-08-09 LAB — HEPATITIS C ANTIBODY: HCV AB: NEGATIVE

## 2015-08-10 NOTE — Progress Notes (Signed)
Patient discussed with Dr. Hess. Agree with assessment and plan of care per his note.   

## 2015-10-08 ENCOUNTER — Encounter: Payer: Self-pay | Admitting: Internal Medicine

## 2015-12-31 ENCOUNTER — Ambulatory Visit (INDEPENDENT_AMBULATORY_CARE_PROVIDER_SITE_OTHER): Payer: Managed Care, Other (non HMO) | Admitting: Family Medicine

## 2015-12-31 VITALS — BP 140/80 | HR 80 | Temp 98.7°F | Resp 16 | Ht 65.35 in | Wt 185.0 lb

## 2015-12-31 DIAGNOSIS — H9209 Otalgia, unspecified ear: Secondary | ICD-10-CM | POA: Diagnosis not present

## 2015-12-31 NOTE — Progress Notes (Signed)
   12/31/2015 3:36 PM   DOB: 11/12/1960 / MRN: [id number]  SUBJECTIVE:  Candice Mcbride is a 55 y.o. female presenting for ear pain that started 5 days ago.    She is allergic to codeine and morphine and related.   She  has a past medical history of Hypertension; Depression; and Hyperlipidemia.    She  reports that she has quit smoking. She does not have any smokeless tobacco history on file. She reports that she drinks alcohol. She reports that she does not use illicit drugs. She  has no sexual activity history on file. The patient  has past surgical history that includes Cesarean section; Tonsillectomy and adenoidectomy; laparoscopic appendectomy (03/16/2012); Appendectomy; and Tubal ligation.  Her family history includes Diabetes in her father, other, and sister; Diverticulitis in her mother; Heart attack in her father; Heart disease in her father; Hypertension in her mother, other, and sister.  Review of Systems  Constitutional: Negative for fever and chills.  Eyes: Negative for blurred vision.  Respiratory: Negative for cough and shortness of breath.   Cardiovascular: Negative for chest pain.  Gastrointestinal: Negative for nausea and abdominal pain.  Genitourinary: Negative for dysuria, urgency and frequency.  Musculoskeletal: Negative for myalgias.  Skin: Negative for rash.  Neurological: Negative for dizziness, tingling and headaches.  Psychiatric/Behavioral: Negative for depression. The patient is not nervous/anxious.     Problem list and medications reviewed and updated by myself where necessary, and exist elsewhere in the encounter.   OBJECTIVE:  BP 140/80 mmHg  Pulse 80  Temp(Src) 98.7 F (37.1 C)  Resp 16  Ht 5' 5.35" (1.66 m)  Wt 185 lb (83.915 kg)  BMI 30.45 kg/m2  SpO2 98%  Physical Exam  Constitutional: She is oriented to person, place, and time. She appears well-developed.  HENT:  Right Ear: Hearing and tympanic membrane normal.  Ears:  Eyes: EOM are  normal. Pupils are equal, round, and reactive to light.  Cardiovascular: Normal rate.   Pulmonary/Chest: Effort normal.  Abdominal: She exhibits no distension.  Musculoskeletal: Normal range of motion.  Neurological: She is alert and oriented to person, place, and time. No cranial nerve deficit.  Skin: Skin is warm and dry. She is not diaphoretic.  Psychiatric: She has a normal mood and affect.  Vitals reviewed.   No results found for this or any previous visit (from the past 72 hour(s)).  No results found.  ASSESSMENT AND PLAN  Rashika was seen today for otalgia, sinusitis and cough.  Diagnoses and all orders for this visit:  Otalgia, unspecified laterality: Likely secondary to mild impaction. Ear disimpacted by me with symptomatic relief. With regard to her sinusitis and cough, these both started with a mild sore throat.  Given this temporal relationship this is most likely viral.  Advised symptomatic therapy per AVS.  RTC in 5-7 days if unimproved.     The patient was advised to call or return to clinic if she does not see an improvement in symptoms or to seek the care of the closest emergency department if she worsens with the above plan.   Deliah Boston, MHS, PA-C [facility] 12/31/2015 3:36 PM

## 2015-12-31 NOTE — Patient Instructions (Signed)
-   You have a viral upper respiratory infection, (the common cold) and it is not uncommon for symptoms to last 10 days to 2 weeks, regardless of which medications you take. Unfortunately antibiotics will not help your symptoms as they target bacteria, and you are likely suffering from a virus. Additionally, 1 in 8 people who take antibiotics suffer mild to severe side effects.    - You would benefit from high dose ibuprofen. TAKE 600-800 mg of ibuprofen every 8 hours as needed to control low grade fever, fatigue, and pain.    - I also recommend that you take Zyrtec-D 5-120 every morning or Claritin-D 10-240 for the next five to 10 days. This medication will help you with nasal congestion, post nasal drip and sneezing. You will have to purchase this directly from the pharmacist   Viral URI Medications: Please consult the pharmacist if you have questions. 600-800 mg ibuprofen every 8 hours as needed for the next five to ten days 5-120 Zyrtec-D every morning for five to 10 days OR Claritin D 10-240 every morning for five days.  Please visit the CDC's website https://www.cdc.gov/getsmart/ to learn about appropriate and inappropriate uses of antibiotics.    

## 2016-01-05 NOTE — Progress Notes (Signed)
Patient ID: Candice Mcbride, female   DOB: 1960-12-13, 55 y.o.   MRN: [id number] Reviewed documentation and agree w/ assessment and plan. Norberto Sorenson, MD MPH

## 2016-01-09 ENCOUNTER — Encounter: Payer: Self-pay | Admitting: Internal Medicine

## 2016-07-27 ENCOUNTER — Other Ambulatory Visit: Payer: Self-pay | Admitting: Family Medicine

## 2016-07-27 DIAGNOSIS — F418 Other specified anxiety disorders: Secondary | ICD-10-CM

## 2016-07-28 NOTE — Telephone Encounter (Signed)
Agree with Dr. Clelia CroftShaw.  Deliah BostonMichael Kenneshia Rehm, MS, PA-C 2:23 PM, 07/28/2016

## 2016-07-28 NOTE — Telephone Encounter (Signed)
Tried to call pt w/message that 3 mos supply of citalopram was sent in but she needs to RTC w/in that time to est care with new PCP. VM was full and couldn't leave message.

## 2016-07-28 NOTE — Telephone Encounter (Signed)
[facility], can you handle this refill request for her?

## 2016-07-28 NOTE — Telephone Encounter (Signed)
I have never seen this pt.  She was seen by PA Deliah BostonMichael Clark in February. It would be best for her to RTC to establish with a PCP to get her chronic medications. She could do at her CPE.  Ok to provide a 3 month rx to give pt time to get in.

## 2016-07-29 NOTE — Telephone Encounter (Signed)
Tried to call pt again and VM still full. Sent unable to reach letter with message below.

## 2016-09-05 ENCOUNTER — Other Ambulatory Visit: Payer: Self-pay | Admitting: Family Medicine

## 2016-09-05 DIAGNOSIS — I10 Essential (primary) hypertension: Secondary | ICD-10-CM

## 2016-09-05 NOTE — Telephone Encounter (Signed)
This is not our patient, please [facility]

## 2016-09-17 ENCOUNTER — Encounter: Payer: Managed Care, Other (non HMO) | Admitting: Family Medicine

## 2018-12-18 ENCOUNTER — Encounter (HOSPITAL_COMMUNITY): Payer: Self-pay | Admitting: Emergency Medicine

## 2018-12-18 ENCOUNTER — Inpatient Hospital Stay (HOSPITAL_COMMUNITY)
Admission: EM | Admit: 2018-12-18 | Discharge: 2018-12-20 | DRG: 282 | Disposition: A | Discharge status: Home / Self Care | Payer: BLUE CROSS/BLUE SHIELD | Attending: Cardiology | Admitting: Cardiology

## 2018-12-18 ENCOUNTER — Emergency Department (HOSPITAL_COMMUNITY): Payer: BLUE CROSS/BLUE SHIELD

## 2018-12-18 ENCOUNTER — Other Ambulatory Visit: Payer: Self-pay

## 2018-12-18 DIAGNOSIS — Z833 Family history of diabetes mellitus: Secondary | ICD-10-CM

## 2018-12-18 DIAGNOSIS — Z791 Long term (current) use of non-steroidal anti-inflammatories (NSAID): Secondary | ICD-10-CM

## 2018-12-18 DIAGNOSIS — Z8249 Family history of ischemic heart disease and other diseases of the circulatory system: Secondary | ICD-10-CM

## 2018-12-18 DIAGNOSIS — Z79899 Other long term (current) drug therapy: Secondary | ICD-10-CM

## 2018-12-18 DIAGNOSIS — Z885 Allergy status to narcotic agent status: Secondary | ICD-10-CM

## 2018-12-18 DIAGNOSIS — I10 Essential (primary) hypertension: Secondary | ICD-10-CM | POA: Diagnosis present

## 2018-12-18 DIAGNOSIS — H539 Unspecified visual disturbance: Secondary | ICD-10-CM | POA: Diagnosis present

## 2018-12-18 DIAGNOSIS — I214 Non-ST elevation (NSTEMI) myocardial infarction: Secondary | ICD-10-CM | POA: Diagnosis not present

## 2018-12-18 DIAGNOSIS — R072 Precordial pain: Secondary | ICD-10-CM

## 2018-12-18 DIAGNOSIS — R778 Other specified abnormalities of plasma proteins: Secondary | ICD-10-CM

## 2018-12-18 DIAGNOSIS — Z6828 Body mass index (BMI) 28.0-28.9, adult: Secondary | ICD-10-CM

## 2018-12-18 DIAGNOSIS — I252 Old myocardial infarction: Secondary | ICD-10-CM

## 2018-12-18 DIAGNOSIS — Z9049 Acquired absence of other specified parts of digestive tract: Secondary | ICD-10-CM

## 2018-12-18 DIAGNOSIS — E785 Hyperlipidemia, unspecified: Secondary | ICD-10-CM | POA: Diagnosis present

## 2018-12-18 DIAGNOSIS — R7989 Other specified abnormal findings of blood chemistry: Secondary | ICD-10-CM

## 2018-12-18 DIAGNOSIS — Z8673 Personal history of transient ischemic attack (TIA), and cerebral infarction without residual deficits: Secondary | ICD-10-CM

## 2018-12-18 DIAGNOSIS — R079 Chest pain, unspecified: Secondary | ICD-10-CM

## 2018-12-18 DIAGNOSIS — F418 Other specified anxiety disorders: Secondary | ICD-10-CM | POA: Diagnosis present

## 2018-12-18 DIAGNOSIS — R1013 Epigastric pain: Secondary | ICD-10-CM

## 2018-12-18 LAB — CBC WITH DIFFERENTIAL/PLATELET
ABS IMMATURE GRANULOCYTES: 0.07 10*3/uL (ref 0.00–0.07)
BASOS PCT: 0 %
Basophils Absolute: 0 10*3/uL (ref 0.0–0.1)
Eosinophils Absolute: 0.1 10*3/uL (ref 0.0–0.5)
Eosinophils Relative: 1 %
HCT: 40.2 % (ref 36.0–46.0)
Hemoglobin: 13.3 g/dL (ref 12.0–15.0)
IMMATURE GRANULOCYTES: 1 %
Lymphocytes Relative: 7 %
Lymphs Abs: 1 10*3/uL (ref 0.7–4.0)
MCH: 32.2 pg (ref 26.0–34.0)
MCHC: 33.1 g/dL (ref 30.0–36.0)
MCV: 97.3 fL (ref 80.0–100.0)
Monocytes Absolute: 0.8 10*3/uL (ref 0.1–1.0)
Monocytes Relative: 6 %
NEUTROS ABS: 11.9 10*3/uL — AB (ref 1.7–7.7)
NEUTROS PCT: 85 %
Platelets: 314 10*3/uL (ref 150–400)
RBC: 4.13 MIL/uL (ref 3.87–5.11)
RDW: 13.1 % (ref 11.5–15.5)
WBC: 14 10*3/uL — AB (ref 4.0–10.5)
nRBC: 0 % (ref 0.0–0.2)

## 2018-12-18 LAB — BASIC METABOLIC PANEL
Anion gap: 14 (ref 5–15)
BUN: 15 mg/dL (ref 6–20)
CO2: 23 mmol/L (ref 22–32)
Calcium: 9.4 mg/dL (ref 8.9–10.3)
Chloride: 105 mmol/L (ref 98–111)
Creatinine, Ser: 0.98 mg/dL (ref 0.44–1.00)
GFR calc Af Amer: >60 mL/min (ref >60)
GFR calc non Af Amer: >60 mL/min (ref >60)
Glucose, Bld: 162 mg/dL — ABNORMAL HIGH (ref 70–99)
Potassium: 3.3 mmol/L — ABNORMAL LOW (ref 3.5–5.1)
Sodium: 142 mmol/L (ref 135–145)

## 2018-12-18 LAB — I-STAT BETA HCG BLOOD, ED (MC, WL, AP ONLY): I-stat hCG, quantitative: <5 m[IU]/mL (ref <5)

## 2018-12-18 LAB — I-STAT TROPONIN, ED: Troponin i, poc: 0.3 ng/mL (ref 0.00–0.08)

## 2018-12-18 LAB — HEPATIC FUNCTION PANEL
ALT: 27 U/L (ref 0–44)
AST: 24 U/L (ref 15–41)
Albumin: 4.2 g/dL (ref 3.5–5.0)
Alkaline Phosphatase: 45 U/L (ref 38–126)
Bilirubin, Direct: 0.1 mg/dL (ref 0.0–0.2)
Indirect Bilirubin: 0.5 mg/dL (ref 0.3–0.9)
Total Bilirubin: 0.6 mg/dL (ref 0.3–1.2)
Total Protein: 6.9 g/dL (ref 6.5–8.1)

## 2018-12-18 LAB — LIPASE, BLOOD: Lipase: 33 U/L (ref 11–51)

## 2018-12-18 MED ORDER — ASPIRIN 81 MG PO CHEW
324.0000 mg | CHEWABLE_TABLET | Freq: Once | ORAL | Status: AC
  Administered 2018-12-18: 324 mg via ORAL
  Filled 2018-12-18: qty 4

## 2018-12-18 MED ORDER — HEPARIN SODIUM (PORCINE) 5000 UNIT/ML IJ SOLN
4000.0000 [IU] | Freq: Once | INTRAMUSCULAR | Status: AC
  Administered 2018-12-18: 4000 [IU] via INTRAVENOUS
  Filled 2018-12-18: qty 1

## 2018-12-18 MED ORDER — SODIUM CHLORIDE 0.9 % IV SOLN
INTRAVENOUS | Status: DC
  Administered 2018-12-18: 23:00:00 via INTRAVENOUS

## 2018-12-18 MED ORDER — ONDANSETRON HCL 4 MG/2ML IJ SOLN
4.0000 mg | Freq: Once | INTRAMUSCULAR | Status: AC
  Administered 2018-12-18: 4 mg via INTRAVENOUS
  Filled 2018-12-18: qty 2

## 2018-12-18 MED ORDER — FENTANYL CITRATE (PF) 100 MCG/2ML IJ SOLN
100.0000 ug | Freq: Once | INTRAMUSCULAR | Status: AC
  Administered 2018-12-18: 100 ug via INTRAVENOUS
  Filled 2018-12-18: qty 2

## 2018-12-18 MED ORDER — IOPAMIDOL (ISOVUE-370) INJECTION 76%
INTRAVENOUS | Status: AC
  Filled 2018-12-18: qty 100

## 2018-12-18 MED ORDER — ASPIRIN 81 MG PO CHEW: 324.0000 mg | CHEWABLE_TABLET | Freq: Once | ORAL | Status: DC

## 2018-12-18 NOTE — ED Triage Notes (Signed)
Patient from home, has a sudden feeling of needing to have a BM.  She is now having chest pain and nausea at the Xiphoid process area.  She has vomited a few times per EMS.  Patient did have some mid back pain, the pain radiates from her front to the back.  She has been given 8mg  of Zofran by EMS.  HR has decreased from 60's to 50's.  She has been given 324mg  ASA and 2 sl nitro with no relief of her symptoms.

## 2018-12-18 NOTE — ED Provider Notes (Signed)
[facility]   CSN: [id number] Arrival date & time: 12/18/18  2157     History   Chief Complaint Chief Complaint  Patient presents with  . Chest Pain  . Nausea    HPI Candice Mcbride is a 58 y.o. female.  Patient with past medical history remarkable for hypertension and hyperlipidemia, presents to the emergency department with a chief complaint of sudden onset sharp and stabbing chest pain that radiates to her back and down into her abdomen.  Reports having associated nausea and vomiting.  States her pain is severe.  Denies any fevers or chills.  She was brought to the ED by EMS and was given nitroglycerin and Zofran.  She has never experienced pain like this before.  She denies any numbness or tingling in her lower extremities.  The history is provided by the patient. No language interpreter was used.    Past Medical History:  Diagnosis Date  . Depression   . Hyperlipidemia   . Hypertension     Patient Active Problem List   Diagnosis Date Noted  . Encounter for screening colonoscopy 08/08/2015  . Hyperlipidemia 04/27/2012  . Essential hypertension 04/27/2012  . Depression with anxiety 04/27/2012  . BMI 28.0-28.9,adult 04/27/2012  . Appendicitis, acute 03/15/2012    Past Surgical History:  Procedure Laterality Date  . APPENDECTOMY    . CESAREAN SECTION    . LAPAROSCOPIC APPENDECTOMY  03/16/2012   Procedure: APPENDECTOMY LAPAROSCOPIC;  Surgeon: Velora Hecklerodd M Gerkin, MD;  Location: [facility];  Service: General;  Laterality: N/A;  . TONSILLECTOMY AND ADENOIDECTOMY    . TUBAL LIGATION       OB History   No obstetric history on file.      Home Medications    Prior to Admission medications   Medication Sig Start Date End Date Taking? Authorizing Provider  albuterol (PROVENTIL HFA;VENTOLIN HFA) 108 (90 BASE) MCG/ACT inhaler Inhale 2 puffs into the lungs every 4 (four) hours as needed for wheezing or shortness of breath  (cough, shortness of breath or wheezing.). Patient not taking: Reported on 12/31/2015 11/21/14   Dorna LeitzBush, Nicole V, PA-C  atorvastatin (LIPITOR) 80 MG tablet Take 1 tablet (80 mg total) by mouth daily. 08/08/15 08/07/16  Hess, Twana FirstBryan R, DO  citalopram (CELEXA) 40 MG tablet TAKE ONE TABLET BY MOUTH ONCE DAILY.  Needs visit for any additional refills. 07/28/16   Sherren MochaShaw, Eva N, MD  ibuprofen (ADVIL) 200 MG tablet 2-3 tablets as needed every 6 hours.  Or you can use plain tylenol as directed. Patient not taking: Reported on 11/21/2014 03/17/12   Sherrie GeorgeJennings, Willard, PA-C  lisinopril-hydrochlorothiazide (PRINZIDE,ZESTORETIC) 20-12.5 MG tablet TAKE ONE TABLET BY MOUTH ONCE DAILY 09/09/16   Wallis BambergMani, Mario, PA-C    Family History Family History  Problem Relation Age of Onset  . Heart attack Father   . Diabetes Father   . Heart disease Father   . Diverticulitis Mother   . Hypertension Mother   . Diabetes Sister   . Hypertension Sister   . Hypertension Other   . Diabetes Other     Social History Social History   Tobacco Use  . Smoking status: Former Games developermoker  . Smokeless tobacco: Never Used  Substance Use Topics  . Alcohol use: Yes    Comment: occassional  . Drug use: No     Allergies   Codeine and Morphine and related   Review of Systems Review of Systems  All other systems reviewed and  are negative.    Physical Exam Updated Vital Signs BP (!) 146/88 (BP Location: Right Arm)   Pulse (!) 52   Temp (!) 97.3 F (36.3 C) (Oral)   Resp 11   SpO2 100%   Physical Exam Vitals signs and nursing note reviewed.  Constitutional:      Appearance: She is well-developed.  HENT:     Head: Normocephalic and atraumatic.  Eyes:     Conjunctiva/sclera: Conjunctivae normal.     Pupils: Pupils are equal, round, and reactive to light.  Neck:     Musculoskeletal: Normal range of motion and neck supple.  Cardiovascular:     Rate and Rhythm: Normal rate and regular rhythm.     Heart sounds: No murmur. No  friction rub. No gallop.   Pulmonary:     Effort: Pulmonary effort is normal. No respiratory distress.     Breath sounds: Normal breath sounds. No wheezing or rales.  Chest:     Chest wall: No tenderness.  Abdominal:     General: Bowel sounds are normal. There is no distension.     Palpations: Abdomen is soft. There is no mass.     Tenderness: There is no abdominal tenderness. There is no guarding or rebound.  Musculoskeletal: Normal range of motion.        General: No tenderness.  Skin:    General: Skin is warm and dry.  Neurological:     Mental Status: She is alert and oriented to person, place, and time.  Psychiatric:        Behavior: Behavior normal.        Thought Content: Thought content normal.        Judgment: Judgment normal.      ED Treatments / Results  Labs (all labs ordered are listed, but only abnormal results are displayed) Labs Reviewed  CBC WITH DIFFERENTIAL/PLATELET  BASIC METABOLIC PANEL  LIPASE, BLOOD  HEPATIC FUNCTION PANEL  I-STAT TROPONIN, ED    EKG EKG Interpretation  Date/Time:  Saturday December 18 2018 22:02:04 EST Ventricular Rate:  55 PR Interval:  142 QRS Duration: 88 QT Interval:  474 QTC Calculation: 453 R Axis:   74 Text Interpretation:  Sinus bradycardia No previous tracing Confirmed by Cathren LaineSteinl, Kevin ([id number]) on 12/18/2018 10:10:58 PM   Radiology No results found.  Procedures Procedures (including critical care time) CRITICAL CARE Performed by: Roxy Horsemanobert Tam Savoia  Elevated troponin, heparin, chest pain  Total critical care time: 43 minutes  Critical care time was exclusive of separately billable procedures and treating other patients.  Critical care was necessary to treat or prevent imminent or life-threatening deterioration.  Critical care was time spent personally by me on the following activities: development of treatment plan with patient and/or surrogate as well as nursing, discussions with consultants, evaluation of  patient's response to treatment, examination of patient, obtaining history from patient or surrogate, ordering and performing treatments and interventions, ordering and review of laboratory studies, ordering and review of radiographic studies, pulse oximetry and re-evaluation of patient's condition.  Medications Ordered in ED Medications  ondansetron (ZOFRAN) injection 4 mg (has no administration in time range)  fentaNYL (SUBLIMAZE) injection 100 mcg (has no administration in time range)  aspirin chewable tablet 324 mg (has no administration in time range)     Initial Impression / Assessment and Plan / ED Course  I have reviewed the triage vital signs and the nursing notes.  Pertinent labs & imaging results that were available during my care of  the patient were reviewed by me and considered in my medical decision making (see chart for details).     Patient presents to the emergency department with sudden onset chest pain that started around 6 PM tonight.  Pain radiates down into her abdomen and into her back.  She has had associated shortness of breath, nausea, and vomiting.  Denies any lower extremity weakness, numbness, or tingling.  Denies any history of the same.  Check labs, patient appears very uncomfortable, will check CT dissection study and reassess.  Troponin is elevated at 0.3.  Repeat EKG ordered shows some minor ST elevation changes per Dr. Denton Lank.  Code STEMI activated by Dr. Denton Lank, patient given heparin, has already received aspirin.  Dr. Denton Lank discussed the case with Dr. Tresa Endo and with the cardiology fellow, who have deactivated the code STEMI, will admit patient for chest pain, elevated troponin, and for monitoring.  Will proceed with CT dissection study.  Final Clinical Impressions(s) / ED Diagnoses   Final diagnoses:  Chest pain, unspecified type  Epigastric pain    ED Discharge Orders    None       Roxy Horseman, PA-C 12/19/18 0011    Cathren Laine,  MD 12/19/18 [phone]

## 2018-12-18 NOTE — ED Notes (Signed)
Called in STEMI

## 2018-12-19 ENCOUNTER — Other Ambulatory Visit: Payer: Self-pay

## 2018-12-19 ENCOUNTER — Emergency Department (HOSPITAL_COMMUNITY): Payer: BLUE CROSS/BLUE SHIELD

## 2018-12-19 DIAGNOSIS — Z885 Allergy status to narcotic agent status: Secondary | ICD-10-CM | POA: Diagnosis not present

## 2018-12-19 DIAGNOSIS — R7989 Other specified abnormal findings of blood chemistry: Secondary | ICD-10-CM | POA: Diagnosis not present

## 2018-12-19 DIAGNOSIS — E782 Mixed hyperlipidemia: Secondary | ICD-10-CM | POA: Diagnosis not present

## 2018-12-19 DIAGNOSIS — I214 Non-ST elevation (NSTEMI) myocardial infarction: Secondary | ICD-10-CM

## 2018-12-19 DIAGNOSIS — F418 Other specified anxiety disorders: Secondary | ICD-10-CM | POA: Diagnosis present

## 2018-12-19 DIAGNOSIS — Z79899 Other long term (current) drug therapy: Secondary | ICD-10-CM | POA: Diagnosis not present

## 2018-12-19 DIAGNOSIS — I10 Essential (primary) hypertension: Secondary | ICD-10-CM

## 2018-12-19 DIAGNOSIS — R9431 Abnormal electrocardiogram [ECG] [EKG]: Secondary | ICD-10-CM

## 2018-12-19 DIAGNOSIS — I252 Old myocardial infarction: Secondary | ICD-10-CM | POA: Diagnosis not present

## 2018-12-19 DIAGNOSIS — Z8249 Family history of ischemic heart disease and other diseases of the circulatory system: Secondary | ICD-10-CM | POA: Diagnosis not present

## 2018-12-19 DIAGNOSIS — Z791 Long term (current) use of non-steroidal anti-inflammatories (NSAID): Secondary | ICD-10-CM | POA: Diagnosis not present

## 2018-12-19 DIAGNOSIS — Z9049 Acquired absence of other specified parts of digestive tract: Secondary | ICD-10-CM | POA: Diagnosis not present

## 2018-12-19 DIAGNOSIS — E785 Hyperlipidemia, unspecified: Secondary | ICD-10-CM

## 2018-12-19 DIAGNOSIS — I2510 Atherosclerotic heart disease of native coronary artery without angina pectoris: Secondary | ICD-10-CM | POA: Diagnosis not present

## 2018-12-19 DIAGNOSIS — H539 Unspecified visual disturbance: Secondary | ICD-10-CM | POA: Diagnosis present

## 2018-12-19 DIAGNOSIS — Z8673 Personal history of transient ischemic attack (TIA), and cerebral infarction without residual deficits: Secondary | ICD-10-CM | POA: Diagnosis not present

## 2018-12-19 DIAGNOSIS — Z833 Family history of diabetes mellitus: Secondary | ICD-10-CM | POA: Diagnosis not present

## 2018-12-19 DIAGNOSIS — R1013 Epigastric pain: Secondary | ICD-10-CM | POA: Diagnosis not present

## 2018-12-19 LAB — APTT: aPTT: 26 seconds (ref 24–36)

## 2018-12-19 LAB — LIPID PANEL
Cholesterol: 197 mg/dL (ref 0–200)
HDL: 57 mg/dL (ref >40)
LDL Cholesterol: 120 mg/dL — ABNORMAL HIGH (ref 0–99)
Total CHOL/HDL Ratio: 3.5 RATIO
Triglycerides: 100 mg/dL (ref <150)
VLDL: 20 mg/dL (ref 0–40)

## 2018-12-19 LAB — BASIC METABOLIC PANEL
Anion gap: 11 (ref 5–15)
BUN: 11 mg/dL (ref 6–20)
CO2: 23 mmol/L (ref 22–32)
Calcium: 9.2 mg/dL (ref 8.9–10.3)
Chloride: 108 mmol/L (ref 98–111)
Creatinine, Ser: 0.88 mg/dL (ref 0.44–1.00)
GFR calc Af Amer: >60 mL/min (ref >60)
GFR calc non Af Amer: >60 mL/min (ref >60)
Glucose, Bld: 104 mg/dL — ABNORMAL HIGH (ref 70–99)
Potassium: 4.2 mmol/L (ref 3.5–5.1)
Sodium: 142 mmol/L (ref 135–145)

## 2018-12-19 LAB — CBC
HCT: 38.4 % (ref 36.0–46.0)
Hemoglobin: 12.3 g/dL (ref 12.0–15.0)
MCH: 31.9 pg (ref 26.0–34.0)
MCHC: 32 g/dL (ref 30.0–36.0)
MCV: 99.5 fL (ref 80.0–100.0)
Platelets: 297 10*3/uL (ref 150–400)
RBC: 3.86 MIL/uL — ABNORMAL LOW (ref 3.87–5.11)
RDW: 13.4 % (ref 11.5–15.5)
WBC: 8.1 10*3/uL (ref 4.0–10.5)
nRBC: 0 % (ref 0.0–0.2)

## 2018-12-19 LAB — HEPARIN LEVEL (UNFRACTIONATED)
Heparin Unfractionated: 0.28 IU/mL — ABNORMAL LOW (ref 0.30–0.70)
Heparin Unfractionated: 0.43 IU/mL (ref 0.30–0.70)

## 2018-12-19 LAB — PROTIME-INR
INR: 1.04
Prothrombin Time: 13.5 seconds (ref 11.4–15.2)

## 2018-12-19 LAB — TROPONIN I
Troponin I: 0.28 ng/mL (ref <0.03)
Troponin I: 0.96 ng/mL (ref <0.03)
Troponin I: 0.68 ng/mL (ref <0.03)

## 2018-12-19 LAB — HEMOGLOBIN A1C
Hgb A1c MFr Bld: 6.2 % — ABNORMAL HIGH (ref 4.8–5.6)
Mean Plasma Glucose: 131.24 mg/dL

## 2018-12-19 LAB — HIV ANTIBODY (ROUTINE TESTING W REFLEX): HIV Screen 4th Generation wRfx: NONREACTIVE

## 2018-12-19 LAB — BRAIN NATRIURETIC PEPTIDE: B Natriuretic Peptide: 54.7 pg/mL (ref 0.0–100.0)

## 2018-12-19 MED ORDER — SODIUM CHLORIDE 0.9% FLUSH
3.0000 mL | Freq: Two times a day (BID) | INTRAVENOUS | Status: DC
  Administered 2018-12-19 – 2018-12-20 (×2): 3 mL via INTRAVENOUS

## 2018-12-19 MED ORDER — NITROGLYCERIN 0.4 MG SL SUBL: 0.4000 mg | SUBLINGUAL_TABLET | SUBLINGUAL | Status: DC | PRN

## 2018-12-19 MED ORDER — POTASSIUM CHLORIDE CRYS ER 20 MEQ PO TBCR
40.0000 meq | EXTENDED_RELEASE_TABLET | ORAL | Status: AC
  Administered 2018-12-19 (×2): 40 meq via ORAL
  Filled 2018-12-19 (×2): qty 2

## 2018-12-19 MED ORDER — SODIUM CHLORIDE 0.9% FLUSH: 3.0000 mL | INTRAVENOUS | Status: DC | PRN

## 2018-12-19 MED ORDER — ONDANSETRON HCL 4 MG/2ML IJ SOLN: 4.0000 mg | Freq: Four times a day (QID) | INTRAMUSCULAR | Status: DC | PRN

## 2018-12-19 MED ORDER — ATORVASTATIN CALCIUM 80 MG PO TABS
80.0000 mg | ORAL_TABLET | Freq: Every day | ORAL | Status: DC
  Filled 2018-12-19 (×3): qty 1

## 2018-12-19 MED ORDER — HYDROCHLOROTHIAZIDE 12.5 MG PO CAPS
12.5000 mg | ORAL_CAPSULE | Freq: Every day | ORAL | Status: DC
  Administered 2018-12-19: 12.5 mg via ORAL
  Filled 2018-12-19: qty 1

## 2018-12-19 MED ORDER — ACETAMINOPHEN 325 MG PO TABS
650.0000 mg | ORAL_TABLET | ORAL | Status: DC | PRN
  Administered 2018-12-19 (×3): 650 mg via ORAL
  Filled 2018-12-19 (×3): qty 2

## 2018-12-19 MED ORDER — LISINOPRIL 10 MG PO TABS
20.0000 mg | ORAL_TABLET | Freq: Every day | ORAL | Status: DC
  Administered 2018-12-19: 20 mg via ORAL
  Filled 2018-12-19: qty 2

## 2018-12-19 MED ORDER — LISINOPRIL-HYDROCHLOROTHIAZIDE 20-12.5 MG PO TABS: 1.0000 | ORAL_TABLET | Freq: Every day | ORAL | Status: DC

## 2018-12-19 MED ORDER — CITALOPRAM HYDROBROMIDE 40 MG PO TABS
40.0000 mg | ORAL_TABLET | Freq: Every day | ORAL | Status: DC
  Administered 2018-12-19 – 2018-12-20 (×2): 40 mg via ORAL
  Filled 2018-12-19: qty 1
  Filled 2018-12-19 (×2): qty 2
  Filled 2018-12-19: qty 1

## 2018-12-19 MED ORDER — SODIUM CHLORIDE 0.9 % IV SOLN: 250.0000 mL | INTRAVENOUS | Status: DC | PRN

## 2018-12-19 MED ORDER — SODIUM CHLORIDE 0.9 % WEIGHT BASED INFUSION
3.0000 mL/kg/h | INTRAVENOUS | Status: DC
  Administered 2018-12-20: 3 mL/kg/h via INTRAVENOUS

## 2018-12-19 MED ORDER — ASPIRIN 81 MG PO CHEW
81.0000 mg | CHEWABLE_TABLET | ORAL | Status: AC
  Administered 2018-12-20: 81 mg via ORAL
  Filled 2018-12-19: qty 1

## 2018-12-19 MED ORDER — ASPIRIN EC 81 MG PO TBEC: 81.0000 mg | DELAYED_RELEASE_TABLET | Freq: Every day | ORAL | Status: DC

## 2018-12-19 MED ORDER — HEPARIN (PORCINE) 25000 UT/250ML-% IV SOLN
1100.0000 [IU]/h | INTRAVENOUS | Status: DC
  Administered 2018-12-19: 1000 [IU]/h via INTRAVENOUS
  Administered 2018-12-20: 1100 [IU]/h via INTRAVENOUS
  Filled 2018-12-19 (×2): qty 250

## 2018-12-19 MED ORDER — SODIUM CHLORIDE 0.9 % WEIGHT BASED INFUSION: 1.0000 mL/kg/h | INTRAVENOUS | Status: DC

## 2018-12-19 MED ORDER — IOPAMIDOL (ISOVUE-370) INJECTION 76%
100.0000 mL | Freq: Once | INTRAVENOUS | Status: AC | PRN
  Administered 2018-12-19: 100 mL via INTRAVENOUS

## 2018-12-19 NOTE — Progress Notes (Signed)
Progress Note  Patient Name: Candice RoyalSusan J Witkop Date of Encounter: 12/19/2018  Primary Cardiologist: No primary care provider on file.   Subjective   No recurrent chest pain. Has some mild abdominal pain. No constipation or diarrhea.  Inpatient Medications    Scheduled Meds: . [START ON 12/20/2018] aspirin EC  81 mg Oral Daily  . atorvastatin  80 mg Oral Daily  . citalopram  40 mg Oral Daily  . lisinopril  20 mg Oral Daily   And  . hydrochlorothiazide  12.5 mg Oral Daily   Continuous Infusions: . sodium chloride 20 mL/hr at 12/18/18 2325  . heparin 1,100 Units/hr (12/19/18 1036)   PRN Meds: acetaminophen, nitroGLYCERIN, ondansetron (ZOFRAN) IV   Vital Signs    Vitals:   12/19/18 0645 12/19/18 0808 12/19/18 1054 12/19/18 1219  BP: 119/75 (!) 119/96  117/73  Pulse: 77 64  71  Resp: (!) 23 (!) 27 18 19   Temp:  98.2 F (36.8 C)  98.2 F (36.8 C)  TempSrc:  Oral  Oral  SpO2: 98% 94%  97%  Weight:  79.6 kg    Height:  5\' 5"  (1.651 m)     No intake or output data in the 24 hours ending 12/19/18 1310 Filed Weights   12/18/18 2332 12/19/18 0808  Weight: 78 kg 79.6 kg    Telemetry    NSR - Personally Reviewed  ECG    No new tracings - Personally Reviewed  Physical Exam   GEN: No acute distress.   Neck: No JVD Cardiac: RRR, no murmurs, rubs, or gallops.  Respiratory: Clear to auscultation bilaterally. GI: Soft, nontender, non-distended  MS: No edema; No deformity. Neuro:  Nonfocal  Psych: Normal affect   Labs    Chemistry Recent Labs  Lab 12/18/18 2242 12/19/18 0602  NA 142 142  K 3.3* 4.2  CL 105 108  CO2 23 23  GLUCOSE 162* 104*  BUN 15 11  CREATININE 0.98 0.88  CALCIUM 9.4 9.2  PROT 6.9  --   ALBUMIN 4.2  --   AST 24  --   ALT 27  --   ALKPHOS 45  --   BILITOT 0.6  --   GFRNONAA >60 >60  GFRAA >60 >60  ANIONGAP 14 11     Hematology Recent Labs  Lab 12/18/18 2242 12/19/18 0602  WBC 14.0* 8.1  RBC 4.13 3.86*  HGB 13.3 12.3  HCT  40.2 38.4  MCV 97.3 99.5  MCH 32.2 31.9  MCHC 33.1 32.0  RDW 13.1 13.4  PLT 314 297    Cardiac Enzymes Recent Labs  Lab 12/18/18 2242 12/19/18 0602  TROPONINI 0.28* 0.96*    Recent Labs  Lab 12/18/18 2248  TROPIPOC 0.30*     BNP Recent Labs  Lab 12/18/18 2242  BNP 54.7     DDimer No results for input(s): DDIMER in the last 168 hours.   Radiology    Dg Chest Portable 1 View  Result Date: 12/18/2018 CLINICAL DATA:  Chest pain and nausea, has vomited a few times, pain radiating from front to back, history hypertension, hyperlipidemia EXAM: PORTABLE CHEST 1 VIEW COMPARISON:  Portable exam 2240 hours without priors for comparison FINDINGS: Normal heart size, mediastinal contours, and pulmonary vascularity. Lungs clear. No pulmonary infiltrate, pleural effusion or pneumothorax. Bones unremarkable. IMPRESSION: No acute abnormalities. Electronically Signed   By: Ulyses SouthwardMark  Boles M.D.   On: 12/18/2018 22:52   Ct Angio Chest/abd/pel For Dissection W And/or Wo Contrast  Result  Date: 12/19/2018 CLINICAL DATA:  Sudden onset chest pain radiating to back and abdomen. Prior appendectomy. EXAM: CT ANGIOGRAPHY CHEST, ABDOMEN AND PELVIS TECHNIQUE: Multidetector CT imaging through the chest, abdomen and pelvis was performed using the standard protocol during bolus administration of intravenous contrast. Multiplanar reconstructed images and MIPs were obtained and reviewed to evaluate the vascular anatomy. CONTRAST:  100mL ISOVUE-370 IOPAMIDOL (ISOVUE-370) INJECTION 76% COMPARISON:  Chest radiograph from earlier today. FINDINGS: CTA CHEST FINDINGS Cardiovascular: Normal heart size. No significant pericardial effusion/thickening. Left anterior descending and right coronary atherosclerosis. Atherosclerotic nonaneurysmal thoracic aorta. No evidence of acute intramural hematoma, dissection, pseudoaneurysm or penetrating atherosclerotic ulcer in the thoracic aorta. Patent aortic arch vessels. Normal caliber  pulmonary arteries. No central pulmonary emboli. Mediastinum/Nodes: No discrete thyroid nodules. Unremarkable esophagus. No pathologically enlarged axillary, mediastinal or hilar lymph nodes. Lungs/Pleura: No pneumothorax. No pleural effusion. No acute consolidative airspace disease, lung masses or significant pulmonary nodules. Musculoskeletal: No aggressive appearing focal osseous lesions. Mild thoracic spondylosis. Review of the MIP images confirms the above findings. CTA ABDOMEN AND PELVIS FINDINGS VASCULAR Aorta: Atherosclerotic abdominal aorta. Normal caliber aorta without aneurysm, dissection, vasculitis or significant stenosis. Celiac: Patent without evidence of aneurysm, dissection, vasculitis or significant stenosis. SMA: Patent without evidence of aneurysm, dissection, vasculitis or significant stenosis. Renals: Single bilateral renal arteries are patent without evidence of aneurysm, dissection, vasculitis, fibromuscular dysplasia or significant stenosis. IMA: Patent without evidence of aneurysm, dissection, vasculitis or significant stenosis. Inflow: Patent without evidence of aneurysm, dissection, vasculitis or significant stenosis. Veins: No obvious venous abnormality within the limitations of this arterial phase study. Review of the MIP images confirms the above findings. NON-VASCULAR Hepatobiliary: Normal liver with no liver mass. Normal gallbladder with no radiopaque cholelithiasis. No biliary ductal dilatation. Pancreas: Normal, with no mass or duct dilation. Spleen: Normal size. No mass. Adrenals/Urinary Tract: Normal adrenals. Normal kidneys with no hydronephrosis and no renal mass. Normal bladder. Stomach/Bowel: Small hiatal hernia. Otherwise normal nondistended stomach. Normal caliber small bowel with no small bowel wall thickening. Appendectomy. Mild sigmoid diverticulosis, with no large bowel wall thickening or significant pericolonic fat stranding. Vascular/Lymphatic: Atherosclerotic  nonaneurysmal abdominal aorta. No pathologically enlarged lymph nodes in the abdomen or pelvis. Reproductive: Grossly normal uterus.  No adnexal mass. Other: No pneumoperitoneum, ascites or focal fluid collection. Musculoskeletal: No aggressive appearing focal osseous lesions. Review of the MIP images confirms the above findings. IMPRESSION: 1. No acute aortic syndrome. 2. Two-vessel coronary atherosclerosis. 3. Small hiatal hernia. 4. Mild sigmoid diverticulosis. 5. Aortic Atherosclerosis (ICD10-I70.0). Electronically Signed   By: Delbert PhenixJason A Poff M.D.   On: 12/19/2018 00:52    Cardiac Studies   Echo ordered and pending  Patient Profile     58 y.o. female with HTN, HL, depression who presents with sudden onset epigastric discomfort, chest pain, and nausea, found to have NSTEMI.   Assessment & Plan    1. NSTEMI: Symptomatically stable. On IV heparin, ASA, and atorvastatin. Most recent troponin 0.96 (up from 0.28 yesterday). HR low 60's so I am not inclined to start beta blockers at this time. I will arrange for coronary angiography on 12/20/18 given risk factors. If coronary disease is ruled out, consider myopericarditis in differential diagnosis given symptoms, viral prodrome, and ECG findings. Risks and benefits of cardiac catheterization have been discussed with the patient.  These include bleeding, infection, kidney damage, stroke, heart attack, death.  The patient understands these risks and is willing to proceed.  2. HTN: BP normal on lisinopril and HCTZ. No changes.  3. Hyperlipidemia: On atorvastatin 80 mg. LDL 120. TG 100. Had not been taking atorvastatin prior to this hospitalization although it was prescribed.   For questions or updates, please contact [facility] Please consult [email or web address] for contact info under Cardiology/STEMI.      Signed, Prentice Docker, MD  12/19/2018, 1:10 PM

## 2018-12-19 NOTE — H&P (Signed)
History & Physical    Patient ID: Candice Mcbride MRN: [id number], DOB/AGE: 1961-09-14   Admit date: 12/18/2018   Primary Physician: Tonye Pearson, MD Primary Cardiologist: No primary care provider on file.  Patient Profile    Candice Mcbride is a 58 year old woman with HTN, HL, depression who presents with sudden onset epigastric discomfort and nausea, found to have NSTEMI.   Past Medical History    Past Medical History:  Diagnosis Date  . Depression   . Hyperlipidemia   . Hypertension     Past Surgical History:  Procedure Laterality Date  . APPENDECTOMY    . CESAREAN SECTION    . LAPAROSCOPIC APPENDECTOMY  03/16/2012   Procedure: APPENDECTOMY LAPAROSCOPIC;  Surgeon: Velora Heckler, MD;  Location: [facility];  Service: General;  Laterality: N/A;  . TONSILLECTOMY AND ADENOIDECTOMY    . TUBAL LIGATION       Allergies  Allergies  Allergen Reactions  . Codeine Other (See Comments)    Headache   . Dilaudid [Hydromorphone] Nausea And Vomiting  . Morphine And Related Other (See Comments)    Headache     History of Present Illness    Candice Mcbride is a 58 year old woman with HTN, HL, depression who presents with sudden onset epigastric discomfort and nausea, found to have NSTEMI.   The patient reports that she was in her usual state of health during the day of prior to presentation. She felt like she needed to have a BM so got up to use the bathroom at approximately 6 pm. While sitting on the toilet, she had onset of epigastric discomfort which she describes as a ball sensation just below her ribcage. Notes that it felt like she needed to vomit. She did induce vomiting herself although this did not improve her symptoms. Also noted diaphoresis but without shortness of breath. Symptoms were ongoing and thus she presented to the ED at approximately 10 pm. In the ED, vital signs were stable with BP 146/88, HR bradycardic at 52, afebrile and with 100% O2 saturation. Laboratory  evaluation showed POC troponin 0.3. Serial ECGs completed and concern for subtle ST elevation in inferolateral leads led to activation for STEMI. Further evaluation showed <38mm upsloping elevation and thus code STEMI cancelled.   Notably, the patient reports only a couple episodes of chest pain previously, while at work. Describes them as pleuritic in nature and not similar her discomfort this evening. At the time of my evaluation, she has received of fentanyl and has no remaining discomfort or nausea.   Home Medications    Prior to Admission medications   Medication Sig Start Date End Date Taking? Authorizing Provider  citalopram (CELEXA) 40 MG tablet TAKE ONE TABLET BY MOUTH ONCE DAILY.  Needs visit for any additional refills. Patient taking differently: Take 40 mg by mouth daily.  07/28/16  Yes Sherren Mocha, MD  Flaxseed, Linseed, (FLAXSEED OIL PO) Take 750 mg by mouth daily.   Yes [provider]  lisinopril-hydrochlorothiazide (PRINZIDE,ZESTORETIC) 20-12.5 MG tablet TAKE ONE TABLET BY MOUTH ONCE DAILY Patient taking differently: Take 1 tablet by mouth daily.  09/09/16  Yes Wallis Bamberg, PA-C  Multiple Vitamins-Minerals (ONE-A-DAY WOMENS 50+ ADVANTAGE) TABS Take 1 tablet by mouth daily.    Yes [provider]  NON FORMULARY Take 1 tablet by mouth See admin instructions. Neocell (Collagen/vitamin C/Biotin) tablets: Take 1 tablet by mouth once a day   Yes [provider]  NON FORMULARY  Take 1 capsule by mouth daily. Leptitox detox capsules: Take 1 capsule by mouth once a day   Yes [provider]  Red Yeast Rice 600 MG CAPS Take 600 mg by mouth daily.    Yes [provider]  valACYclovir (VALTREX) 1000 MG tablet Take 1,000 mg by mouth daily. FOR 10 DAYS   Yes [provider]  albuterol (PROVENTIL HFA;VENTOLIN HFA) 108 (90 BASE) MCG/ACT inhaler Inhale 2 puffs into the lungs every 4 (four) hours as needed for wheezing or shortness of breath  (cough, shortness of breath or wheezing.). Patient not taking: Reported on 12/18/2018 11/21/14   Dorna LeitzBush, Nicole V, PA-C  atorvastatin (LIPITOR) 80 MG tablet Take 1 tablet (80 mg total) by mouth daily. Patient not taking: Reported on 12/18/2018 08/08/15 12/18/18  Gildardo CrankerHess, Bryan R, DO  ibuprofen (ADVIL) 200 MG tablet 2-3 tablets as needed every 6 hours.  Or you can use plain tylenol as directed. Patient not taking: Reported on 12/18/2018 03/17/12   Sherrie GeorgeJennings, Willard, PA-C    Family History    Family History  Problem Relation Age of Onset  . Heart attack Father   . Diabetes Father   . Heart disease Father   . Diverticulitis Mother   . Hypertension Mother   . Diabetes Sister   . Hypertension Sister   . Hypertension Other   . Diabetes Other    She indicated that her mother is alive. She indicated that her father is alive. She indicated that her sister is alive. She indicated that her maternal grandmother is deceased. She indicated that her maternal grandfather is deceased. She indicated that her paternal grandmother is deceased. She indicated that her paternal grandfather is deceased. She indicated that the status of her other is unknown.   Social History    Social History   Socioeconomic History  . Marital status: Married    Spouse name: Not on file  . Number of children: Not on file  . Years of education: Not on file  . Highest education level: Not on file  Occupational History  . Not on file  Social Needs  . Financial resource strain: Not on file  . Food insecurity:    Worry: Not on file    Inability: Not on file  . Transportation needs:    Medical: Not on file    Non-medical: Not on file  Tobacco Use  . Smoking status: Former Games developermoker  . Smokeless tobacco: Never Used  Substance and Sexual Activity  . Alcohol use: Yes    Comment: occassional  . Drug use: No  . Sexual activity: Not on file  Lifestyle  . Physical activity:    Days per week: Not on file    Minutes per session: Not on  file  . Stress: Not on file  Relationships  . Social connections:    Talks on phone: Not on file    Gets together: Not on file    Attends religious service: Not on file    Active member of club or organization: Not on file    Attends meetings of clubs or organizations: Not on file    Relationship status: Not on file  . Intimate partner violence:    Fear of current or ex partner: Not on file    Emotionally abused: Not on file    Physically abused: Not on file    Forced sexual activity: Not on file  Other Topics Concern  . Not on file  Social History Narrative  .  Not on file     Review of Systems    General:  No chills, fever, night sweats or weight changes.  Cardiovascular:  No chest pain, dyspnea on exertion, edema, orthopnea, palpitations, paroxysmal nocturnal dyspnea. Dermatological: No rash, lesions/masses Respiratory: No cough, dyspnea Urologic: No hematuria, dysuria Abdominal:   No nausea, vomiting, diarrhea, bright red blood per rectum, melena, or hematemesis Neurologic:  No visual changes, wkns, changes in mental status. All other systems reviewed and are otherwise negative except as noted above.  Physical Exam    Blood pressure (!) 146/88, pulse (!) 52, temperature (!) 97.3 F (36.3 C), temperature source Oral, resp. rate 11, height 5\' 5"  (1.651 m), weight 78 kg, SpO2 100 %.  General: Pleasant, NAD Psych: Normal affect. Neuro: Alert and oriented X 3. Moves all extremities spontaneously. HEENT: Normal  Neck: Supple without bruits or JVD. Lungs:  Resp regular and unlabored, CTA. Heart: RRR no s3, s4, or murmurs. Abdomen: Soft, non-tender, non-distended, BS + x 4.  Extremities: No clubbing, cyanosis or edema. DP/PT/Radials 2+ and equal bilaterally.  Labs    Troponin [facility] of Care Test) Recent Labs    12/18/18 2248  TROPIPOC 0.30*   No results for input(s): CKTOTAL, CKMB, TROPONINI in the last 72 hours. Lab Results  Component Value Date   WBC 14.0 (H)  12/18/2018   HGB 13.3 12/18/2018   HCT 40.2 12/18/2018   MCV 97.3 12/18/2018   PLT 314 12/18/2018    Recent Labs  Lab 12/18/18 2242  NA 142  K 3.3*  CL 105  CO2 23  BUN 15  CREATININE 0.98  CALCIUM 9.4  PROT 6.9  BILITOT 0.6  ALKPHOS 45  ALT 27  AST 24  GLUCOSE 162*   Lab Results  Component Value Date   CHOL 302 (H) 08/08/2015   HDL 56 08/08/2015   LDLCALC 218 (H) 08/08/2015   TRIG 140 08/08/2015   No results found for: [facility]   Radiology Studies    Dg Chest Portable 1 View  Result Date: 12/18/2018 CLINICAL DATA:  Chest pain and nausea, has vomited a few times, pain radiating from front to back, history hypertension, hyperlipidemia EXAM: PORTABLE CHEST 1 VIEW COMPARISON:  Portable exam 2240 hours without priors for comparison FINDINGS: Normal heart size, mediastinal contours, and pulmonary vascularity. Lungs clear. No pulmonary infiltrate, pleural effusion or pneumothorax. Bones unremarkable. IMPRESSION: No acute abnormalities. Electronically Signed   By: Ulyses Southward M.D.   On: 12/18/2018 22:52    ECG & Cardiac Imaging    12/18/18 - personally reviewed. Sinus rhythm. 0.33mm STE V3-V6 and II/III/AVF. Upsloping and without evolution over the 90 minutes since first ECG.  Assessment & Plan    Ms. Pashia is a 58 year old woman with HTN, HL, depression who presents with sudden onset epigastric discomfort and nausea, found to have NSTEMI.   # NSTEMI Risk factors for CAD include age, HTN, HL. History of MI in her father when he was in his 56s. She does not smoke. Discomfort described is atypical in nature but with elevated troponin at time of arrival, approximately 5 hours after symptoms initially started. ECG as above, does not meet criteria for STEMI and patient is symptom free at the time of my evaluation. Does have upsloping sub-mm STE and PR elevation in AVR. Could represent myopericarditis but would favor work-up and cath to evaluate for CAD given age and risk factors.  -  Cont to trend troponin - Trend ECGs - ASA 324 given,  will start 81mg  daily - Heparin bolus given in ED, pharmacy consult for hep gtt placed - TTE in the AM - Lipid panel, A1C - atorvastatin 80mg  daily - Hold BB given bradycardia - [facility]  # HTN - cont home lisinopril/HCTZ - Will not initiate BB given baseline bradycardia  # HL - Continue home atorvastatin, notably patient was not taking although it was prescribed - Lipid panel pending as above  # Depression - cont home citalopram  # FEN - Low fat, low cholesterol diet - Supplement potassium  # FULL CODE  Signed, Starlyn SkeansAngela Haider Hornaday, MD 12/19/2018, 12:20 AM

## 2018-12-19 NOTE — Progress Notes (Signed)
ANTICOAGULATION CONSULT NOTE - Initial Consult  Pharmacy Consult for heparin Indication: NSTEMI  Allergies  Allergen Reactions  . Codeine Other (See Comments)    Headache   . Dilaudid [Hydromorphone] Nausea And Vomiting  . Morphine And Related Other (See Comments)    Headache     Patient Measurements: Height: 5\' 5"  (165.1 cm) Weight: 172 lb (78 kg) IBW/kg (Calculated) : 57 Heparin Dosing Weight: 75kg  Vital Signs: Temp: 97.3 F (36.3 C) (02/08 2212) Temp Source: Oral (02/08 2212) BP: 146/88 (02/08 2212) Pulse Rate: 52 (02/08 2212)  Labs: Recent Labs    12/18/18 2242  HGB 13.3  HCT 40.2  PLT 314  CREATININE 0.98    Estimated Creatinine Clearance: 65.4 mL/min (by C-G formula based on SCr of 0.98 mg/dL).   Medical History: Past Medical History:  Diagnosis Date  . Depression   . Hyperlipidemia   . Hypertension      Assessment: 58yo female c/o epigastric discomfort associated w/ N/V/diaphoresis >> admitted for NSTEMI, to begin heparin gtt.  Goal of Therapy:  Heparin level 0.3-0.7 units/ml Monitor platelets by anticoagulation protocol: Yes   Plan:  Rec'd heparin 4000 units IV in ED; will begin heparin gtt at 1000 units/hr and monitor heparin levels and CBC.  Vernard GamblesVeronda Jordi Kamm, PharmD, BCPS  12/19/2018,12:33 AM

## 2018-12-19 NOTE — Progress Notes (Addendum)
ANTICOAGULATION CONSULT NOTE   Pharmacy Consult for heparin Indication: NSTEMI  Allergies  Allergen Reactions  . Codeine Other (See Comments)    Headache   . Dilaudid [Hydromorphone] Nausea And Vomiting  . Morphine And Related Other (See Comments)    Headache     Patient Measurements: Height: 5\' 5"  (165.1 cm) Weight: 175 lb 8 oz (79.6 kg) IBW/kg (Calculated) : 57 Heparin Dosing Weight: 75kg  Vital Signs: Temp: 98.2 F (36.8 C) (02/09 0808) Temp Source: Oral (02/09 0808) BP: 119/96 (02/09 0808) Pulse Rate: 64 (02/09 0808)  Labs: Recent Labs    12/18/18 2242 12/19/18 0602 12/19/18 0735  HGB 13.3 12.3  --   HCT 40.2 38.4  --   PLT 314 297  --   APTT 26  --   --   LABPROT 13.5  --   --   INR 1.04  --   --   HEPARINUNFRC  --   --  0.28*  CREATININE 0.98 0.88  --   TROPONINI 0.28* 0.96*  --     Estimated Creatinine Clearance: 73.5 mL/min (by C-G formula based on SCr of 0.88 mg/dL).   Medical History: Past Medical History:  Diagnosis Date  . Depression   . Hyperlipidemia   . Hypertension      Assessment: 58yo female c/o epigastric discomfort associated w/ N/V/diaphoresis >> admitted for NSTEMI on heparin gtt. Plans noted for cath on 2/10 -heparin level= 0.28  Goal of Therapy:  Heparin level 0.3-0.7 units/ml Monitor platelets by anticoagulation protocol: Yes   Plan:  -Increase heparin to 1100 units/hr -Heparin level in 6 hours and daily wth CBC daily  Harland German, PharmD Clinical Pharmacist **Pharmacist phone directory can now be found on [email or web address] ([email or web address]).  Listed under [facility].  Addendum -heparin level at goal on 1100 units/hr  PLan -no heparin changes needed  Harland German, PharmD Clinical Pharmacist **Pharmacist phone directory can now be found on [email or web address] ([email or web address]).  Listed under [facility].

## 2018-12-19 NOTE — Progress Notes (Addendum)
Echocardiogram order not complete.  Note from Dr. Purvis Sheffield, MD (cardiology) referred to page [facility].  Called [facility] (Dr. Shelby Dubin, MD) to ask if a STAT echo would be needed prior to tomorrow's catheterization.  Per Dr. Shelby Dubin a STAT echo was not warranted and rounding team should be consulting on Monday morning.

## 2018-12-20 ENCOUNTER — Encounter (HOSPITAL_COMMUNITY)
Admission: EM | Disposition: A | Discharge status: Home / Self Care | Payer: Self-pay | Source: Home / Self Care | Attending: Cardiology

## 2018-12-20 ENCOUNTER — Inpatient Hospital Stay (HOSPITAL_COMMUNITY): Payer: BLUE CROSS/BLUE SHIELD

## 2018-12-20 DIAGNOSIS — R1013 Epigastric pain: Secondary | ICD-10-CM

## 2018-12-20 DIAGNOSIS — I2510 Atherosclerotic heart disease of native coronary artery without angina pectoris: Secondary | ICD-10-CM

## 2018-12-20 DIAGNOSIS — R7989 Other specified abnormal findings of blood chemistry: Secondary | ICD-10-CM

## 2018-12-20 DIAGNOSIS — E782 Mixed hyperlipidemia: Secondary | ICD-10-CM

## 2018-12-20 DIAGNOSIS — R9431 Abnormal electrocardiogram [ECG] [EKG]: Secondary | ICD-10-CM

## 2018-12-20 DIAGNOSIS — R072 Precordial pain: Secondary | ICD-10-CM

## 2018-12-20 DIAGNOSIS — R778 Other specified abnormalities of plasma proteins: Secondary | ICD-10-CM

## 2018-12-20 HISTORY — PX: LEFT HEART CATH AND CORONARY ANGIOGRAPHY: CATH118249

## 2018-12-20 LAB — BASIC METABOLIC PANEL
Anion gap: 9 (ref 5–15)
BUN: 9 mg/dL (ref 6–20)
CHLORIDE: 106 mmol/L (ref 98–111)
CO2: 24 mmol/L (ref 22–32)
CREATININE: 0.84 mg/dL (ref 0.44–1.00)
Calcium: 9.2 mg/dL (ref 8.9–10.3)
GFR calc Af Amer: >60 mL/min (ref >60)
GFR calc non Af Amer: >60 mL/min (ref >60)
Glucose, Bld: 94 mg/dL (ref 70–99)
Potassium: 4 mmol/L (ref 3.5–5.1)
Sodium: 139 mmol/L (ref 135–145)

## 2018-12-20 LAB — D-DIMER, QUANTITATIVE: D-Dimer, Quant: <0.27 ug/mL-FEU (ref 0.00–0.50)

## 2018-12-20 LAB — CBC
HCT: 39.7 % (ref 36.0–46.0)
Hemoglobin: 12.9 g/dL (ref 12.0–15.0)
MCH: 32.2 pg (ref 26.0–34.0)
MCHC: 32.5 g/dL (ref 30.0–36.0)
MCV: 99 fL (ref 80.0–100.0)
Platelets: 278 10*3/uL (ref 150–400)
RBC: 4.01 MIL/uL (ref 3.87–5.11)
RDW: 13.5 % (ref 11.5–15.5)
WBC: 6.1 10*3/uL (ref 4.0–10.5)
nRBC: 0 % (ref 0.0–0.2)

## 2018-12-20 LAB — ECHOCARDIOGRAM COMPLETE
Height: 65 in
Weight: 2763.2 oz

## 2018-12-20 LAB — HEPARIN LEVEL (UNFRACTIONATED): Heparin Unfractionated: 0.6 IU/mL (ref 0.30–0.70)

## 2018-12-20 SURGERY — LEFT HEART CATH AND CORONARY ANGIOGRAPHY
Anesthesia: LOCAL

## 2018-12-20 MED ORDER — SODIUM CHLORIDE 0.9 % IV SOLN: 250.0000 mL | INTRAVENOUS | Status: DC | PRN

## 2018-12-20 MED ORDER — FENTANYL CITRATE (PF) 100 MCG/2ML IJ SOLN
INTRAMUSCULAR | Status: DC | PRN
  Administered 2018-12-20 (×2): 25 ug via INTRAVENOUS

## 2018-12-20 MED ORDER — ONDANSETRON HCL 4 MG/2ML IJ SOLN: 4.0000 mg | Freq: Four times a day (QID) | INTRAMUSCULAR | Status: DC | PRN

## 2018-12-20 MED ORDER — SODIUM CHLORIDE 0.9 % IV SOLN
INTRAVENOUS | Status: AC
  Administered 2018-12-20: 15:00:00 via INTRAVENOUS

## 2018-12-20 MED ORDER — ATORVASTATIN CALCIUM 80 MG PO TABS: 80.0000 mg | ORAL_TABLET | Freq: Every day | ORAL | 3 refills | Status: DC

## 2018-12-20 MED ORDER — LIDOCAINE HCL (PF) 1 % IJ SOLN
INTRAMUSCULAR | Status: AC
  Filled 2018-12-20: qty 30

## 2018-12-20 MED ORDER — MIDAZOLAM HCL 2 MG/2ML IJ SOLN
INTRAMUSCULAR | Status: DC | PRN
  Administered 2018-12-20: 2 mg via INTRAVENOUS
  Administered 2018-12-20: 1 mg via INTRAVENOUS

## 2018-12-20 MED ORDER — ACETAMINOPHEN 325 MG PO TABS: 650.0000 mg | ORAL_TABLET | ORAL | Status: DC | PRN

## 2018-12-20 MED ORDER — MIDAZOLAM HCL 2 MG/2ML IJ SOLN
INTRAMUSCULAR | Status: AC
  Filled 2018-12-20: qty 2

## 2018-12-20 MED ORDER — LIDOCAINE HCL (PF) 1 % IJ SOLN
INTRAMUSCULAR | Status: DC | PRN
  Administered 2018-12-20: 2 mL

## 2018-12-20 MED ORDER — SODIUM CHLORIDE 0.9% FLUSH: 3.0000 mL | INTRAVENOUS | Status: DC | PRN

## 2018-12-20 MED ORDER — HEPARIN SODIUM (PORCINE) 1000 UNIT/ML IJ SOLN
INTRAMUSCULAR | Status: DC | PRN
  Administered 2018-12-20: 4000 [IU] via INTRAVENOUS

## 2018-12-20 MED ORDER — VERAPAMIL HCL 2.5 MG/ML IV SOLN
INTRAVENOUS | Status: AC
  Filled 2018-12-20: qty 2

## 2018-12-20 MED ORDER — IOHEXOL 350 MG/ML SOLN
INTRAVENOUS | Status: DC | PRN
  Administered 2018-12-20: 50 mL via INTRAVENOUS

## 2018-12-20 MED ORDER — FENTANYL CITRATE (PF) 100 MCG/2ML IJ SOLN
INTRAMUSCULAR | Status: AC
  Filled 2018-12-20: qty 2

## 2018-12-20 MED ORDER — NITROGLYCERIN 0.4 MG SL SUBL: 0.4000 mg | SUBLINGUAL_TABLET | SUBLINGUAL | 0 refills | Status: AC | PRN

## 2018-12-20 MED ORDER — VERAPAMIL HCL 2.5 MG/ML IV SOLN
INTRAVENOUS | Status: DC | PRN
  Administered 2018-12-20: 10 mL via INTRA_ARTERIAL

## 2018-12-20 MED ORDER — ASPIRIN 81 MG PO TBEC: 81.0000 mg | DELAYED_RELEASE_TABLET | Freq: Every day | ORAL | Status: AC

## 2018-12-20 MED ORDER — HEPARIN (PORCINE) IN NACL 1000-0.9 UT/500ML-% IV SOLN
INTRAVENOUS | Status: AC
  Filled 2018-12-20: qty 500

## 2018-12-20 MED ORDER — SODIUM CHLORIDE 0.9% FLUSH: 3.0000 mL | Freq: Two times a day (BID) | INTRAVENOUS | Status: DC

## 2018-12-20 MED ORDER — HEPARIN SODIUM (PORCINE) 1000 UNIT/ML IJ SOLN
INTRAMUSCULAR | Status: AC
  Filled 2018-12-20: qty 1

## 2018-12-20 MED ORDER — HEPARIN (PORCINE) IN NACL 1000-0.9 UT/500ML-% IV SOLN
INTRAVENOUS | Status: DC | PRN
  Administered 2018-12-20 (×2): 500 mL

## 2018-12-20 SURGICAL SUPPLY — 9 items
CATH 5FR JL3.5 JR4 ANG PIG MP (CATHETERS) ×2 IMPLANT
DEVICE RAD COMP TR BAND LRG (VASCULAR PRODUCTS) ×2 IMPLANT
GLIDESHEATH SLEND SS 6F .021 (SHEATH) ×2 IMPLANT
GUIDEWIRE INQWIRE 1.5J.035X260 (WIRE) ×1 IMPLANT
INQWIRE 1.5J .035X260CM (WIRE) ×2
KIT HEART LEFT (KITS) ×2 IMPLANT
PACK CARDIAC CATHETERIZATION (CUSTOM PROCEDURE TRAY) ×2 IMPLANT
TRANSDUCER W/STOPCOCK (MISCELLANEOUS) ×2 IMPLANT
TUBING CIL FLEX 10 FLL-RA (TUBING) ×2 IMPLANT

## 2018-12-20 NOTE — Progress Notes (Signed)
CARDIAC REHAB PHASE I    Pt just returned from cath lab. Left heart healthy diet and Phase 2 brochure at bedside. Will refer to CRP II [place].  [id number] Reynold Bowen, RN BSN 12/20/2018 3:12 PM

## 2018-12-20 NOTE — Progress Notes (Signed)
ANTICOAGULATION CONSULT NOTE   Pharmacy Consult for heparin Indication: NSTEMI  Allergies  Allergen Reactions  . Codeine Other (See Comments)    Headache   . Dilaudid [Hydromorphone] Nausea And Vomiting  . Morphine And Related Other (See Comments)    Headache     Patient Measurements: Height: 5\' 5"  (165.1 cm) Weight: 172 lb 11.2 oz (78.3 kg) IBW/kg (Calculated) : 57 Heparin Dosing Weight: 75kg  Vital Signs: Temp: 97.6 F (36.4 C) (02/10 0517) Temp Source: Oral (02/10 0517) BP: 125/63 (02/10 0517) Pulse Rate: 63 (02/10 0517)  Labs: Recent Labs    12/18/18 2242 12/19/18 0602 12/19/18 0735 12/19/18 1145 12/19/18 1627 12/20/18 0328  HGB 13.3 12.3  --   --   --  12.9  HCT 40.2 38.4  --   --   --  39.7  PLT 314 297  --   --   --  278  APTT 26  --   --   --   --   --   LABPROT 13.5  --   --   --   --   --   INR 1.04  --   --   --   --   --   HEPARINUNFRC  --   --  0.28*  --  0.43 0.60  CREATININE 0.98 0.88  --   --   --   --   TROPONINI 0.28* 0.96*  --  0.68*  --   --     Estimated Creatinine Clearance: 72.9 mL/min (by C-G formula based on SCr of 0.88 mg/dL).   Medical History: Past Medical History:  Diagnosis Date  . Depression   . Hyperlipidemia   . Hypertension      Assessment: 41 yoF admitted with N/V and epigastric discomfort on IV heparin for NSTEMI. Cath planned today, heparin level therapeutic, CBC stable.   Goal of Therapy:  Heparin level 0.3-0.7 units/ml Monitor platelets by anticoagulation protocol: Yes   Plan:  -Continue heparin to 1100 units/hr -Heparin level daily and CBC  Fredonia Highland, PharmD, BCPS Clinical Pharmacist [phone] Please check AMION for all [facility] numbers 12/20/2018

## 2018-12-20 NOTE — Progress Notes (Addendum)
Progress Note  Patient Name: Candice Mcbride Date of Encounter: 12/20/2018  Primary Cardiologist: New to [facility]-Dr. Purvis Sheffield   Subjective   Pt feeling better today. Plan for cath today. Currently undergoing echocardiogram   Inpatient Medications    Scheduled Meds: . aspirin EC  81 mg Oral Daily  . atorvastatin  80 mg Oral Daily  . citalopram  40 mg Oral Daily  . lisinopril  20 mg Oral Daily   And  . hydrochlorothiazide  12.5 mg Oral Daily  . sodium chloride flush  3 mL Intravenous Q12H   Continuous Infusions: . sodium chloride 20 mL/hr at 12/18/18 2325  . sodium chloride    . sodium chloride    . heparin 1,100 Units/hr (12/20/18 0133)   PRN Meds: sodium chloride, acetaminophen, nitroGLYCERIN, ondansetron (ZOFRAN) IV, sodium chloride flush   Vital Signs    Vitals:   12/19/18 1948 12/19/18 2334 12/20/18 0500 12/20/18 0517  BP: (!) 137/56 124/67  125/63  Pulse: 77 77  63  Resp: (!) 21 20 19 13   Temp: 97.6 F (36.4 C) 98.1 F (36.7 C)  97.6 F (36.4 C)  TempSrc: Oral Oral  Oral  SpO2: 99% 99%  99%  Weight:   78.3 kg   Height:        Intake/Output Summary (Last 24 hours) at 12/20/2018 0739 Last data filed at 12/20/2018 9629 Gross per 24 hour  Intake 1874.55 ml  Output -  Net 1874.55 ml   Filed Weights   12/18/18 2332 12/19/18 0808 12/20/18 0500  Weight: 78 kg 79.6 kg 78.3 kg   Physical Exam   General: Well developed, well nourished, NAD Skin: Warm, dry, intact  Head: Normocephalic, atraumatic, clear, moist mucus membranes. Neck: Negative for carotid bruits. No JVD Lungs:Clear to ausculation bilaterally. No wheezes, rales, or rhonchi. Breathing is unlabored. Cardiovascular: RRR with S1 S2. No murmurs, rubs, gallops, or LV heave appreciated. Abdomen: Soft, non-tender, non-distended with normoactive bowel sounds. No obvious abdominal masses. MSK: Strength and tone appear normal for age. 5/5 in all extremities Extremities: No edema. No clubbing or  cyanosis. DP/PT pulses 2+ bilaterally Neuro: Alert and oriented. No focal deficits. No facial asymmetry. MAE spontaneously. Psych: Responds to questions appropriately with normal affect.    Labs    Chemistry Recent Labs  Lab 12/18/18 2242 12/19/18 0602  NA 142 142  K 3.3* 4.2  CL 105 108  CO2 23 23  GLUCOSE 162* 104*  BUN 15 11  CREATININE 0.98 0.88  CALCIUM 9.4 9.2  PROT 6.9  --   ALBUMIN 4.2  --   AST 24  --   ALT 27  --   ALKPHOS 45  --   BILITOT 0.6  --   GFRNONAA >60 >60  GFRAA >60 >60  ANIONGAP 14 11     Hematology Recent Labs  Lab 12/18/18 2242 12/19/18 0602 12/20/18 0328  WBC 14.0* 8.1 6.1  RBC 4.13 3.86* 4.01  HGB 13.3 12.3 12.9  HCT 40.2 38.4 39.7  MCV 97.3 99.5 99.0  MCH 32.2 31.9 32.2  MCHC 33.1 32.0 32.5  RDW 13.1 13.4 13.5  PLT 314 297 278    Cardiac Enzymes Recent Labs  Lab 12/18/18 2242 12/19/18 0602 12/19/18 1145  TROPONINI 0.28* 0.96* 0.68*    Recent Labs  Lab 12/18/18 2248  TROPIPOC 0.30*     BNP Recent Labs  Lab 12/18/18 2242  BNP 54.7     DDimer No results for input(s): DDIMER in  the last 168 hours.   Radiology    Dg Chest Portable 1 View  Result Date: 12/18/2018 CLINICAL DATA:  Chest pain and nausea, has vomited a few times, pain radiating from front to back, history hypertension, hyperlipidemia EXAM: PORTABLE CHEST 1 VIEW COMPARISON:  Portable exam 2240 hours without priors for comparison FINDINGS: Normal heart size, mediastinal contours, and pulmonary vascularity. Lungs clear. No pulmonary infiltrate, pleural effusion or pneumothorax. Bones unremarkable. IMPRESSION: No acute abnormalities. Electronically Signed   By: Ulyses Southward M.D.   On: 12/18/2018 22:52   Ct Angio Chest/abd/pel For Dissection W And/or Wo Contrast  Result Date: 12/19/2018 CLINICAL DATA:  Sudden onset chest pain radiating to back and abdomen. Prior appendectomy. EXAM: CT ANGIOGRAPHY CHEST, ABDOMEN AND PELVIS TECHNIQUE: Multidetector CT imaging  through the chest, abdomen and pelvis was performed using the standard protocol during bolus administration of intravenous contrast. Multiplanar reconstructed images and MIPs were obtained and reviewed to evaluate the vascular anatomy. CONTRAST:  ISOVUE-370 IOPAMIDOL (ISOVUE-370) INJECTION 76% COMPARISON:  Chest radiograph from earlier today. FINDINGS: CTA CHEST FINDINGS Cardiovascular: Normal heart size. No significant pericardial effusion/thickening. Left anterior descending and right coronary atherosclerosis. Atherosclerotic nonaneurysmal thoracic aorta. No evidence of acute intramural hematoma, dissection, pseudoaneurysm or penetrating atherosclerotic ulcer in the thoracic aorta. Patent aortic arch vessels. Normal caliber pulmonary arteries. No central pulmonary emboli. Mediastinum/Nodes: No discrete thyroid nodules. Unremarkable esophagus. No pathologically enlarged axillary, mediastinal or hilar lymph nodes. Lungs/Pleura: No pneumothorax. No pleural effusion. No acute consolidative airspace disease, lung masses or significant pulmonary nodules. Musculoskeletal: No aggressive appearing focal osseous lesions. Mild thoracic spondylosis. Review of the MIP images confirms the above findings. CTA ABDOMEN AND PELVIS FINDINGS VASCULAR Aorta: Atherosclerotic abdominal aorta. Normal caliber aorta without aneurysm, dissection, vasculitis or significant stenosis. Celiac: Patent without evidence of aneurysm, dissection, vasculitis or significant stenosis. SMA: Patent without evidence of aneurysm, dissection, vasculitis or significant stenosis. Renals: Single bilateral renal arteries are patent without evidence of aneurysm, dissection, vasculitis, fibromuscular dysplasia or significant stenosis. IMA: Patent without evidence of aneurysm, dissection, vasculitis or significant stenosis. Inflow: Patent without evidence of aneurysm, dissection, vasculitis or significant stenosis. Veins: No obvious venous abnormality within  the limitations of this arterial phase study. Review of the MIP images confirms the above findings. NON-VASCULAR Hepatobiliary: Normal liver with no liver mass. Normal gallbladder with no radiopaque cholelithiasis. No biliary ductal dilatation. Pancreas: Normal, with no mass or duct dilation. Spleen: Normal size. No mass. Adrenals/Urinary Tract: Normal adrenals. Normal kidneys with no hydronephrosis and no renal mass. Normal bladder. Stomach/Bowel: Small hiatal hernia. Otherwise normal nondistended stomach. Normal caliber small bowel with no small bowel wall thickening. Appendectomy. Mild sigmoid diverticulosis, with no large bowel wall thickening or significant pericolonic fat stranding. Vascular/Lymphatic: Atherosclerotic nonaneurysmal abdominal aorta. No pathologically enlarged lymph nodes in the abdomen or pelvis. Reproductive: Grossly normal uterus.  No adnexal mass. Other: No pneumoperitoneum, ascites or focal fluid collection. Musculoskeletal: No aggressive appearing focal osseous lesions. Review of the MIP images confirms the above findings. IMPRESSION: 1. No acute aortic syndrome. 2. Two-vessel coronary atherosclerosis. 3. Small hiatal hernia. 4. Mild sigmoid diverticulosis. 5. Aortic Atherosclerosis (ICD10-I70.0). Electronically Signed   By: Delbert Phenix M.D.   On: 12/19/2018 00:52   Telemetry    12/20/2018 NSR - Personally Reviewed  ECG    12/20/2018: NSR with non-specific T wave abnormalities - Personally Reviewed  Cardiac Studies   Echocardiogram: Pending   Cardiac catheterization: Pending   Patient Profile     58 y.o. female  with HTN, HL, depression who presents with sudden onset epigastric discomfort, chest pain, and nausea, found to have NSTEMI.  Assessment & Plan    1.  NSTEMI: -Stable this AM with no recurrent chest pain symptoms -Trop, 0.28>0.96>0.68 -Plan for cath today for further ischemic evaluation  -Continue IV hep gtt  -Echocardiogram with pending  results -Continue ASA, atorvastatin   2.  HTN: -Stable, 124/67>137/56>135/68 -Will hold lisinopril this AM in the setting of cath today>>resume in the post cath setting    3.  Hyperlipidemia: -Elevated, LDL 120 -Continue atorvastatin 80 mg -Per chart review, not taking atorvastatin prior to hospitalization   Signed, Georgie ChardJill McDaniel NP-C [facility] Pager: [phone] 12/20/2018, 7:39 AM    I have examined the patient and reviewed assessment and plan and discussed with patient.  Agree with above as stated.  Mildly elevated troponin.  No further CP.  Plan for cath today.  All questions answered.   Cardiac catheterization was discussed with the patient fully. The patient understands that risks include but are not limited to stroke (1 in 1000), death (1 in 1000), kidney failure [usually temporary] (1 in 500), bleeding (1 in 200), allergic reaction [possibly serious] (1 in 200).  The patient understands and is willing to proceed.     Lance MussJayadeep Ryelynn Guedea  For questions or updates, please contact   Please consult [email or web address] for contact info under Cardiology/STEMI.

## 2018-12-20 NOTE — Interval H&P Note (Signed)
Cath Lab Visit (complete for each Cath Lab visit)  Clinical Evaluation Leading to the Procedure:   ACS: Yes.    Non-ACS:    Anginal Classification: CCS IV  Anti-ischemic medical therapy: Minimal Therapy (1 class of medications)  Non-Invasive Test Results: No non-invasive testing performed  Prior CABG: No previous CABG      History and Physical Interval Note:  12/20/2018 2:10 PM  Candice Mcbride  has presented today for surgery, with the diagnosis of NSTEMI  The various methods of treatment have been discussed with the patient and family. After consideration of risks, benefits and other options for treatment, the patient has consented to  Procedure(s): LEFT HEART CATH AND CORONARY ANGIOGRAPHY (N/A) as a surgical intervention .  The patient's history has been reviewed, patient examined, no change in status, stable for surgery.  I have reviewed the patient's chart and labs.  Questions were answered to the patient's satisfaction.     Lance Muss

## 2018-12-20 NOTE — Discharge Summary (Addendum)
Discharge Summary    Patient ID: Candice Mcbride MRN: [id number]; DOB: 1961/06/22  Admit date: 12/18/2018 Discharge date: 12/20/2018  Primary Care Provider: Mitchel Honour, DO  Primary Cardiologist: Dr. Eldridge Dace, MD  Discharge Diagnoses    Principal Problem:   NSTEMI (non-ST elevated myocardial infarction) [facility]) Active Problems:   Hyperlipidemia   Essential hypertension   Depression with anxiety   BMI 28.0-28.9,adult   Precordial chest pain   Elevated troponin  Allergies Allergies  Allergen Reactions  . Codeine Other (See Comments)    Headache   . Dilaudid [Hydromorphone] Nausea And Vomiting  . Morphine And Related Other (See Comments)    Headache    Diagnostic Studies/Procedures    Cardiac catheterization 12/20/2018:  Prox RCA lesion is 30% stenosed.  Prox LAD to Mid LAD lesion is 30% stenosed.  Prox Cx to Mid Cx lesion is 25% stenosed.  The left ventricular systolic function is normal.  LV end diastolic pressure is normal. LVEDP 13 mm Hg.  The left ventricular ejection fraction is 50-55% by visual estimate.  There is no aortic valve stenosis.   Mild, nonobstructive disease.  Coronary calcium is present.    Would continue aspirin for now.   Uncelar if troponin elevation came from vasospasm, myopericarditis.  WIll check D-dimer to evaluate for PE.    If negative, consider discharge home later today.  Echocardiogram 12/20/2018: IMPRESSIONS   1. The left ventricle has low normal systolic function of 50-55%. The cavity size was normal. There is mildly increased left ventricular wall thickness. Echo evidence of normal diastolic relaxation.  2. The right ventricle has normal systolic function. The cavity was normal. There is no increase in right ventricular wall thickness.  3. The mitral valve is normal in structure.  4. The tricuspid valve is normal in structure.  5. The aortic valve is normal in structure. There is mild calcification of the aortic  valve.  6. The inferior vena cava was dilated in size with <50% respiratory variability.  7. LVEF is approximately 50% with hypokinesis of the mid/distal inferior/inferoseptal walls  History of Present Illness     Candice Mcbride is a 58 year old woman with HTN, HL, depression who presented on 12/19/2018  with sudden onset epigastric discomfort and nausea, found to have NSTEMI.  The patient reported that she was in her usual state of health during the day of prior to presentation. She felt like she needed to have a BM so got up to use the bathroom at approximately 6 pm. While sitting on the toilet, she had onset of epigastric discomfort which she describes as a ball sensation just below her ribcage. She noted that it felt like she needed to vomit. She did induce vomiting herself although this did not improve her symptoms. Also noted diaphoresis but without shortness of breath. Symptoms were ongoing and thus she presented to the ED at approximately 10 pm. In the ED, vital signs were stable with BP 146/88, HR bradycardic at 52, afebrile and with 100% O2 saturation. Laboratory evaluation showed POC troponin 0.3. Serial ECGs completed and concern for subtle ST elevation in inferolateral leads led to activation for STEMI. Further evaluation showed <43mm upsloping elevation and thus code STEMI cancelled.   Notably, the patient reported only a couple episodes of chest pain previously, while at work. She described them as pleuritic in nature and not similar her discomfort this evening. At the time of evaluation, she had received of fentanyl and had no remaining discomfort or  nausea.   Hospital Course     She was admitted to Cardiology service with plans for cardiac catheterization, scheduled for 12/20/2018. Risk factors for CAD include age, HTN, HL. She has a history of MI in her father when he was in his 5970s. Discomfort described is atypical in nature but with elevated troponin at time of arrival,  approximately 5 hours after symptoms initially started. ECG as above, did not meet criteria for STEMI and patient is symptom free at the time of initial Cardiology evaluation. Troponin continued to trend up from 0.28>0.96>0.68. She was started on a Hep gtt, ASA and statin. He lisinopril was held for cath. Her LDL on presentation was noted to be 120 although patient reported not taking atorvastatin, although this was prescribed.   Pt was taken to the cath lab which revealed mild, nonobstructive disease with coronary calcium present. LV was estimated at 50-55% by visual estimate confirmed by echocardiogram. Recommendations were for ASA 81 daily for now. Per cath note, it is unclear if troponin elevation came from vasospasm, myopericarditis. A D-dimer was checked to evaluate for PE which was found to be negative at <0.27.   Pt cath site unremarkable. VSS. Will arrange for follow up to assess symptoms and site. Information provided for obtaining PCP. Pt has ambulated with cardiac rehabilitation without complication.   Consultants: None   The patient was seen and examined by Dr. Eldridge DaceVaranasi who feels that she is stable and ready for discharge today, 12/20/2018.  _____________  Discharge Vitals Blood pressure 123/84, pulse (!) 56, temperature 98.1 F (36.7 C), temperature source Oral, resp. rate 11, height 5\' 5"  (1.651 m), weight 78.3 kg, SpO2 99 %.  Filed Weights   12/18/18 2332 12/19/18 0808 12/20/18 0500  Weight: 78 kg 79.6 kg 78.3 kg   Labs & Radiologic Studies    CBC Recent Labs    12/18/18 2242 12/19/18 0602 12/20/18 0328  WBC 14.0* 8.1 6.1  NEUTROABS 11.9*  --   --   HGB 13.3 12.3 12.9  HCT 40.2 38.4 39.7  MCV 97.3 99.5 99.0  PLT 314 297 278   Basic Metabolic Panel Recent Labs    16/08/9601/09/20 0602 12/20/18 1112  NA 142 139  K 4.2 4.0  CL 108 106  CO2 23 24  GLUCOSE 104* 94  BUN 11 9  CREATININE 0.88 0.84  CALCIUM 9.2 9.2   Liver Function Tests Recent Labs    12/18/18 2242    AST 24  ALT 27  ALKPHOS 45  BILITOT 0.6  PROT 6.9  ALBUMIN 4.2   Recent Labs    12/18/18 2242  LIPASE 33   Cardiac Enzymes Recent Labs    12/18/18 2242 12/19/18 0602 12/19/18 1145  TROPONINI 0.28* 0.96* 0.68*   D-Dimer Recent Labs    12/20/18 1435  DDIMER <0.27   Hemoglobin A1C Recent Labs    12/18/18 2242  HGBA1C 6.2*   Fasting Lipid Panel Recent Labs    12/19/18 0602  CHOL 197  HDL 57  LDLCALC 120*  TRIG 100  CHOLHDL 3.5  _____________  Dg Chest Portable 1 View  Result Date: 12/18/2018 CLINICAL DATA:  Chest pain and nausea, has vomited a few times, pain radiating from front to back, history hypertension, hyperlipidemia EXAM: PORTABLE CHEST 1 VIEW COMPARISON:  Portable exam 2240 hours without priors for comparison FINDINGS: Normal heart size, mediastinal contours, and pulmonary vascularity. Lungs clear. No pulmonary infiltrate, pleural effusion or pneumothorax. Bones unremarkable. IMPRESSION: No acute abnormalities. Electronically Signed  By: Ulyses Southward M.D.   On: 12/18/2018 22:52   Ct Angio Chest/abd/pel For Dissection W And/or Wo Contrast  Result Date: 12/19/2018 CLINICAL DATA:  Sudden onset chest pain radiating to back and abdomen. Prior appendectomy. EXAM: CT ANGIOGRAPHY CHEST, ABDOMEN AND PELVIS TECHNIQUE: Multidetector CT imaging through the chest, abdomen and pelvis was performed using the standard protocol during bolus administration of intravenous contrast. Multiplanar reconstructed images and MIPs were obtained and reviewed to evaluate the vascular anatomy. CONTRAST:  ISOVUE-370 IOPAMIDOL (ISOVUE-370) INJECTION 76% COMPARISON:  Chest radiograph from earlier today. FINDINGS: CTA CHEST FINDINGS Cardiovascular: Normal heart size. No significant pericardial effusion/thickening. Left anterior descending and right coronary atherosclerosis. Atherosclerotic nonaneurysmal thoracic aorta. No evidence of acute intramural hematoma, dissection, pseudoaneurysm or  penetrating atherosclerotic ulcer in the thoracic aorta. Patent aortic arch vessels. Normal caliber pulmonary arteries. No central pulmonary emboli. Mediastinum/Nodes: No discrete thyroid nodules. Unremarkable esophagus. No pathologically enlarged axillary, mediastinal or hilar lymph nodes. Lungs/Pleura: No pneumothorax. No pleural effusion. No acute consolidative airspace disease, lung masses or significant pulmonary nodules. Musculoskeletal: No aggressive appearing focal osseous lesions. Mild thoracic spondylosis. Review of the MIP images confirms the above findings. CTA ABDOMEN AND PELVIS FINDINGS VASCULAR Aorta: Atherosclerotic abdominal aorta. Normal caliber aorta without aneurysm, dissection, vasculitis or significant stenosis. Celiac: Patent without evidence of aneurysm, dissection, vasculitis or significant stenosis. SMA: Patent without evidence of aneurysm, dissection, vasculitis or significant stenosis. Renals: Single bilateral renal arteries are patent without evidence of aneurysm, dissection, vasculitis, fibromuscular dysplasia or significant stenosis. IMA: Patent without evidence of aneurysm, dissection, vasculitis or significant stenosis. Inflow: Patent without evidence of aneurysm, dissection, vasculitis or significant stenosis. Veins: No obvious venous abnormality within the limitations of this arterial phase study. Review of the MIP images confirms the above findings. NON-VASCULAR Hepatobiliary: Normal liver with no liver mass. Normal gallbladder with no radiopaque cholelithiasis. No biliary ductal dilatation. Pancreas: Normal, with no mass or duct dilation. Spleen: Normal size. No mass. Adrenals/Urinary Tract: Normal adrenals. Normal kidneys with no hydronephrosis and no renal mass. Normal bladder. Stomach/Bowel: Small hiatal hernia. Otherwise normal nondistended stomach. Normal caliber small bowel with no small bowel wall thickening. Appendectomy. Mild sigmoid diverticulosis, with no large bowel  wall thickening or significant pericolonic fat stranding. Vascular/Lymphatic: Atherosclerotic nonaneurysmal abdominal aorta. No pathologically enlarged lymph nodes in the abdomen or pelvis. Reproductive: Grossly normal uterus.  No adnexal mass. Other: No pneumoperitoneum, ascites or focal fluid collection. Musculoskeletal: No aggressive appearing focal osseous lesions. Review of the MIP images confirms the above findings. IMPRESSION: 1. No acute aortic syndrome. 2. Two-vessel coronary atherosclerosis. 3. Small hiatal hernia. 4. Mild sigmoid diverticulosis. 5. Aortic Atherosclerosis (ICD10-I70.0). Electronically Signed   By: Delbert Phenix M.D.   On: 12/19/2018 00:52   Disposition   Pt is being discharged home today in good condition.  Follow-up Plans & Appointments   Follow-up Information    [facility] Follow up.   Why:  Call [phone] for physician referral assistance (they will provide a short list of primary care doctors currently taking new patients)- you can also call toll free # on back of your insurance card for a list of in-network providers       [place], [place], [place] Follow up on 01/04/2019.   Specialty:  Cardiology Why:  Your follow up appointment will be on 01/04/2019 at 3pm.  Contact information: [address] [place] [address] [phone]          Discharge Instructions    Amb Referral  to Cardiac Rehabilitation   Complete by:  As directed    Diagnosis:  NSTEMI   Call MD for:  difficulty breathing, headache or visual disturbances   Complete by:  As directed    Call MD for:  extreme fatigue   Complete by:  As directed    Call MD for:  hives   Complete by:  As directed    Call MD for:  persistant dizziness or light-headedness   Complete by:  As directed    Call MD for:  persistant nausea and vomiting   Complete by:  As directed    Call MD for:  redness, tenderness, or signs of infection (pain, swelling, redness, odor or green/yellow discharge  around incision site)   Complete by:  As directed    Call MD for:  severe uncontrolled pain   Complete by:  As directed    Call MD for:  temperature >100.4   Complete by:  As directed    Diet - low sodium heart healthy   Complete by:  As directed    Discharge instructions   Complete by:  As directed    No driving for 3 days. No lifting over 5 lbs for 1 week. No sexual activity for 1 week. You may return to work on 12/27/2018. Keep procedure site clean & dry. If you notice increased pain, swelling, bleeding or pus, call/return!  You may shower, but no soaking baths/hot tubs/pools for 1 week.   If you notice any bleeding such as blood in stool, black tarry stools, blood in urine, nosebleeds or any other unusual bleeding, call your doctor immediately. It is not normal to have this kind of bleeding while on a blood thinner and usually indicates there is an underlying problem with one of your body systems that needs to be checked out.   Increase activity slowly   Complete by:  As directed       Discharge Medications   Allergies as of 12/20/2018      Reactions   Codeine Other (See Comments)   Headache    Dilaudid [hydromorphone] Nausea And Vomiting   Morphine And Related Other (See Comments)   Headache      Medication List    STOP taking these medications   ibuprofen 200 MG tablet Commonly known as:  ADVIL     TAKE these medications   albuterol 108 (90 Base) MCG/ACT inhaler Commonly known as:  PROVENTIL HFA;VENTOLIN HFA Inhale 2 puffs into the lungs every 4 (four) hours as needed for wheezing or shortness of breath (cough, shortness of breath or wheezing.).   aspirin 81 MG EC tablet Take 1 tablet (81 mg total) by mouth daily. Start taking on:  December 21, 2018   atorvastatin 80 MG tablet Commonly known as:  LIPITOR Take 1 tablet (80 mg total) by mouth daily.   citalopram 40 MG tablet Commonly known as:  CELEXA TAKE ONE TABLET BY MOUTH ONCE DAILY.  Needs visit for any  additional refills. What changed:    how much to take  how to take this  when to take this  additional instructions   FLAXSEED OIL PO Take 750 mg by mouth daily.   lisinopril-hydrochlorothiazide 20-12.5 MG tablet Commonly known as:  PRINZIDE,ZESTORETIC TAKE ONE TABLET BY MOUTH ONCE DAILY   nitroGLYCERIN 0.4 MG SL tablet Commonly known as:  NITROSTAT Place 1 tablet (0.4 mg total) under the tongue every 5 (five) minutes x 3 doses as needed for chest pain.  NON FORMULARY Take 1 tablet by mouth See admin instructions. Neocell (Collagen/vitamin C/Biotin) tablets: Take 1 tablet by mouth once a day   NON FORMULARY Take 1 capsule by mouth daily. Leptitox detox capsules: Take 1 capsule by mouth once a day   ONE-A-DAY WOMENS 50+ ADVANTAGE Tabs Take 1 tablet by mouth daily.   Red Yeast Rice 600 MG Caps Take 600 mg by mouth daily.   VALTREX 1000 MG tablet Generic drug:  valACYclovir Take 1,000 mg by mouth daily. FOR 10 DAYS        Acute coronary syndrome (MI, NSTEMI, STEMI, etc) this admission?:  No.  The elevated Troponin was due to the acute medical illness or demand ischemia.    Outstanding Labs/Studies   None   Duration of Discharge Encounter   Greater than 30 minutes including physician time.  Signed, Georgie Chard, NP 12/20/2018, 4:51 PM   I have examined the patient and reviewed assessment and plan and discussed with patient.  Agree with above as stated.  Mild CAD by cath.  Aggressive RF management.    D/w patient re: negative d-Dimer.    OK to discharge.  Daughter will stay with her tonight.   Lance Muss

## 2018-12-20 NOTE — Progress Notes (Signed)
Pt received from cath la. VSS. TR band on RR clean and intact w/ 11cc air. Call light in reach. Will continue to monitor.  Versie Starks, RN

## 2018-12-20 NOTE — Progress Notes (Signed)
  Echocardiogram 2D Echocardiogram has been performed.  Candice Mcbride 12/20/2018, 9:04 AM

## 2018-12-20 NOTE — Progress Notes (Signed)
D/C instructions given to pt. Medications and wound care reviewed. All questions answered. Daughter to escort pt home.  Versie Starks, RN

## 2018-12-20 NOTE — H&P (View-Only) (Signed)
Progress Note  Patient Name: Candice Mcbride Date of Encounter: 12/20/2018  Primary Cardiologist: New to [facility]-Dr. Purvis Sheffield   Subjective   Pt feeling better today. Plan for cath today. Currently undergoing echocardiogram   Inpatient Medications    Scheduled Meds: . aspirin EC  81 mg Oral Daily  . atorvastatin  80 mg Oral Daily  . citalopram  40 mg Oral Daily  . lisinopril  20 mg Oral Daily   And  . hydrochlorothiazide  12.5 mg Oral Daily  . sodium chloride flush  3 mL Intravenous Q12H   Continuous Infusions: . sodium chloride 20 mL/hr at 12/18/18 2325  . sodium chloride    . sodium chloride    . heparin 1,100 Units/hr (12/20/18 0133)   PRN Meds: sodium chloride, acetaminophen, nitroGLYCERIN, ondansetron (ZOFRAN) IV, sodium chloride flush   Vital Signs    Vitals:   12/19/18 1948 12/19/18 2334 12/20/18 0500 12/20/18 0517  BP: (!) 137/56 124/67  125/63  Pulse: 77 77  63  Resp: (!) 21 20 19 13   Temp: 97.6 F (36.4 C) 98.1 F (36.7 C)  97.6 F (36.4 C)  TempSrc: Oral Oral  Oral  SpO2: 99% 99%  99%  Weight:   78.3 kg   Height:        Intake/Output Summary (Last 24 hours) at 12/20/2018 0739 Last data filed at 12/20/2018 9629 Gross per 24 hour  Intake 1874.55 ml  Output -  Net 1874.55 ml   Filed Weights   12/18/18 2332 12/19/18 0808 12/20/18 0500  Weight: 78 kg 79.6 kg 78.3 kg   Physical Exam   General: Well developed, well nourished, NAD Skin: Warm, dry, intact  Head: Normocephalic, atraumatic, clear, moist mucus membranes. Neck: Negative for carotid bruits. No JVD Lungs:Clear to ausculation bilaterally. No wheezes, rales, or rhonchi. Breathing is unlabored. Cardiovascular: RRR with S1 S2. No murmurs, rubs, gallops, or LV heave appreciated. Abdomen: Soft, non-tender, non-distended with normoactive bowel sounds. No obvious abdominal masses. MSK: Strength and tone appear normal for age. 5/5 in all extremities Extremities: No edema. No clubbing or  cyanosis. DP/PT pulses 2+ bilaterally Neuro: Alert and oriented. No focal deficits. No facial asymmetry. MAE spontaneously. Psych: Responds to questions appropriately with normal affect.    Labs    Chemistry Recent Labs  Lab 12/18/18 2242 12/19/18 0602  NA 142 142  K 3.3* 4.2  CL 105 108  CO2 23 23  GLUCOSE 162* 104*  BUN 15 11  CREATININE 0.98 0.88  CALCIUM 9.4 9.2  PROT 6.9  --   ALBUMIN 4.2  --   AST 24  --   ALT 27  --   ALKPHOS 45  --   BILITOT 0.6  --   GFRNONAA >60 >60  GFRAA >60 >60  ANIONGAP 14 11     Hematology Recent Labs  Lab 12/18/18 2242 12/19/18 0602 12/20/18 0328  WBC 14.0* 8.1 6.1  RBC 4.13 3.86* 4.01  HGB 13.3 12.3 12.9  HCT 40.2 38.4 39.7  MCV 97.3 99.5 99.0  MCH 32.2 31.9 32.2  MCHC 33.1 32.0 32.5  RDW 13.1 13.4 13.5  PLT 314 297 278    Cardiac Enzymes Recent Labs  Lab 12/18/18 2242 12/19/18 0602 12/19/18 1145  TROPONINI 0.28* 0.96* 0.68*    Recent Labs  Lab 12/18/18 2248  TROPIPOC 0.30*     BNP Recent Labs  Lab 12/18/18 2242  BNP 54.7     DDimer No results for input(s): DDIMER in  the last 168 hours.   Radiology    Dg Chest Portable 1 View  Result Date: 12/18/2018 CLINICAL DATA:  Chest pain and nausea, has vomited a few times, pain radiating from front to back, history hypertension, hyperlipidemia EXAM: PORTABLE CHEST 1 VIEW COMPARISON:  Portable exam 2240 hours without priors for comparison FINDINGS: Normal heart size, mediastinal contours, and pulmonary vascularity. Lungs clear. No pulmonary infiltrate, pleural effusion or pneumothorax. Bones unremarkable. IMPRESSION: No acute abnormalities. Electronically Signed   By: Mark  Boles M.D.   On: 12/18/2018 22:52   Ct Angio Chest/abd/pel For Dissection W And/or Wo Contrast  Result Date: 12/19/2018 CLINICAL DATA:  Sudden onset chest pain radiating to back and abdomen. Prior appendectomy. EXAM: CT ANGIOGRAPHY CHEST, ABDOMEN AND PELVIS TECHNIQUE: Multidetector CT imaging  through the chest, abdomen and pelvis was performed using the standard protocol during bolus administration of intravenous contrast. Multiplanar reconstructed images and MIPs were obtained and reviewed to evaluate the vascular anatomy. CONTRAST:  100mL ISOVUE-370 IOPAMIDOL (ISOVUE-370) INJECTION 76% COMPARISON:  Chest radiograph from earlier today. FINDINGS: CTA CHEST FINDINGS Cardiovascular: Normal heart size. No significant pericardial effusion/thickening. Left anterior descending and right coronary atherosclerosis. Atherosclerotic nonaneurysmal thoracic aorta. No evidence of acute intramural hematoma, dissection, pseudoaneurysm or penetrating atherosclerotic ulcer in the thoracic aorta. Patent aortic arch vessels. Normal caliber pulmonary arteries. No central pulmonary emboli. Mediastinum/Nodes: No discrete thyroid nodules. Unremarkable esophagus. No pathologically enlarged axillary, mediastinal or hilar lymph nodes. Lungs/Pleura: No pneumothorax. No pleural effusion. No acute consolidative airspace disease, lung masses or significant pulmonary nodules. Musculoskeletal: No aggressive appearing focal osseous lesions. Mild thoracic spondylosis. Review of the MIP images confirms the above findings. CTA ABDOMEN AND PELVIS FINDINGS VASCULAR Aorta: Atherosclerotic abdominal aorta. Normal caliber aorta without aneurysm, dissection, vasculitis or significant stenosis. Celiac: Patent without evidence of aneurysm, dissection, vasculitis or significant stenosis. SMA: Patent without evidence of aneurysm, dissection, vasculitis or significant stenosis. Renals: Single bilateral renal arteries are patent without evidence of aneurysm, dissection, vasculitis, fibromuscular dysplasia or significant stenosis. IMA: Patent without evidence of aneurysm, dissection, vasculitis or significant stenosis. Inflow: Patent without evidence of aneurysm, dissection, vasculitis or significant stenosis. Veins: No obvious venous abnormality within  the limitations of this arterial phase study. Review of the MIP images confirms the above findings. NON-VASCULAR Hepatobiliary: Normal liver with no liver mass. Normal gallbladder with no radiopaque cholelithiasis. No biliary ductal dilatation. Pancreas: Normal, with no mass or duct dilation. Spleen: Normal size. No mass. Adrenals/Urinary Tract: Normal adrenals. Normal kidneys with no hydronephrosis and no renal mass. Normal bladder. Stomach/Bowel: Small hiatal hernia. Otherwise normal nondistended stomach. Normal caliber small bowel with no small bowel wall thickening. Appendectomy. Mild sigmoid diverticulosis, with no large bowel wall thickening or significant pericolonic fat stranding. Vascular/Lymphatic: Atherosclerotic nonaneurysmal abdominal aorta. No pathologically enlarged lymph nodes in the abdomen or pelvis. Reproductive: Grossly normal uterus.  No adnexal mass. Other: No pneumoperitoneum, ascites or focal fluid collection. Musculoskeletal: No aggressive appearing focal osseous lesions. Review of the MIP images confirms the above findings. IMPRESSION: 1. No acute aortic syndrome. 2. Two-vessel coronary atherosclerosis. 3. Small hiatal hernia. 4. Mild sigmoid diverticulosis. 5. Aortic Atherosclerosis (ICD10-I70.0). Electronically Signed   By: Jason A Poff M.D.   On: 12/19/2018 00:52   Telemetry    12/20/2018 NSR - Personally Reviewed  ECG    12/20/2018: NSR with non-specific T wave abnormalities - Personally Reviewed  Cardiac Studies   Echocardiogram: Pending   Cardiac catheterization: Pending   Patient Profile     57 y.o. female   with HTN, HL, depression who presents with sudden onset epigastric discomfort, chest pain, and nausea, found to have NSTEMI.  Assessment & Plan    1.  NSTEMI: -Stable this AM with no recurrent chest pain symptoms -Trop, 0.28>0.96>0.68 -Plan for cath today for further ischemic evaluation  -Continue IV hep gtt  -Echocardiogram with pending  results -Continue ASA, atorvastatin   2.  HTN: -Stable, 124/67>137/56>135/68 -Will hold lisinopril this AM in the setting of cath today>>resume in the post cath setting    3.  Hyperlipidemia: -Elevated, LDL 120 -Continue atorvastatin 80 mg -Per chart review, not taking atorvastatin prior to hospitalization   Signed, Jill McDaniel NP-C [facility] Pager: [phone] 12/20/2018, 7:39 AM    I have examined the patient and reviewed assessment and plan and discussed with patient.  Agree with above as stated.  Mildly elevated troponin.  No further CP.  Plan for cath today.  All questions answered.   Cardiac catheterization was discussed with the patient fully. The patient understands that risks include but are not limited to stroke (1 in 1000), death (1 in 1000), kidney failure [usually temporary] (1 in 500), bleeding (1 in 200), allergic reaction [possibly serious] (1 in 200).  The patient understands and is willing to proceed.     [place]  For questions or updates, please contact   Please consult [email or web address] for contact info under Cardiology/STEMI.  

## 2018-12-21 ENCOUNTER — Encounter (HOSPITAL_COMMUNITY): Payer: Self-pay | Admitting: Interventional Cardiology

## 2018-12-24 ENCOUNTER — Telehealth (HOSPITAL_COMMUNITY): Payer: Self-pay

## 2018-12-24 NOTE — Telephone Encounter (Signed)
Pt insurance is active and benefits verified through [employer]. Co-pay $0.00, DED $10,000.00/$235.73 met, out of pocket $7,350.00/$285.25 met, co-insurance 20%. No pre-authorization required. Passport, 12/24/2018 @ 11:18AM, REF# [id number] ° °Will contact patient to see if she is interested in the Cardiac Rehab Program. If interested, patient will need to complete follow up appt. Once completed, patient will be contacted for scheduling upon review by the RN Navigator. °

## 2019-01-04 ENCOUNTER — Encounter: Payer: Self-pay | Admitting: Physician Assistant

## 2019-01-04 ENCOUNTER — Ambulatory Visit (INDEPENDENT_AMBULATORY_CARE_PROVIDER_SITE_OTHER): Payer: BLUE CROSS/BLUE SHIELD | Admitting: Physician Assistant

## 2019-01-04 VITALS — BP 130/80 | HR 87 | Ht 65.0 in | Wt 178.8 lb

## 2019-01-04 DIAGNOSIS — I10 Essential (primary) hypertension: Secondary | ICD-10-CM

## 2019-01-04 DIAGNOSIS — I2510 Atherosclerotic heart disease of native coronary artery without angina pectoris: Secondary | ICD-10-CM

## 2019-01-04 DIAGNOSIS — E7849 Other hyperlipidemia: Secondary | ICD-10-CM

## 2019-01-04 DIAGNOSIS — Z79899 Other long term (current) drug therapy: Secondary | ICD-10-CM | POA: Diagnosis not present

## 2019-01-04 MED ORDER — ATORVASTATIN CALCIUM 40 MG PO TABS: 40.0000 mg | ORAL_TABLET | Freq: Every day | ORAL | 3 refills | Status: AC

## 2019-01-04 NOTE — Patient Instructions (Addendum)
Medication Instructions:  Your physician has recommended you make the following change in your medication:  1.  DECREASE the Lipitor to 40 mg daily  If you need a refill on your cardiac medications before your next appointment, please call your pharmacy.   Lab work: 02/21/2019: FASTING LIPID AND LFT'S   If you have labs (blood work) drawn today and your tests are completely normal, you will receive your results only by: Marland Kitchen MyChart Message (if you have MyChart) OR . A paper copy in the mail If you have any lab test that is abnormal or we need to change your treatment, we will call you to review the results.  Testing/Procedures: None ordered  Follow-Up: Your physician recommends that you schedule a follow-up appointment in: 4  MONTHS WITH DR. VARANASI  Any Other Special Instructions Will Be Listed Below (If Applicable).

## 2019-01-04 NOTE — Progress Notes (Signed)
Cardiology Office Note    Date:  01/04/2019   ID:  KANDREA BRINKERHOFF, DOB Jan 22, 1961, MRN [id number]  PCP:  Mitchel Honour, DO  Cardiologist: Dr. Eldridge Dace   Chief Complaint: Hospital follow up  History of Present Illness:   Candice Mcbride is a 58 y.o. female with HTN, HL, depression who presented on 12/19/2018 with sudden onset epigastric discomfort and nausea, found to have NSTEMI. Serial ECGs completed and concern for subtle ST elevation in inferolateral leads led to activation for STEMI. Further evaluation showed <39mm upsloping elevation and thus code STEMI cancelled. Troponin peaked at 0.96. treated with heparin. Cath revealed mild, nonobstructive disease with coronary calcium present. LV was estimated at 50-55% by visual estimate confirmed by echocardiogram. Recommendations were for ASA 81 daily for now. Per cath note, it is unclear if troponin elevation came from vasospasm, myopericarditis. A D-dimer was checked to evaluate for PE which was found to be negative at <0.27.   Here today for follow up. Her symptoms is improved significantly. She has intermittent sharp upper sternal chest pain lasting for "few seconds".  No other associated symptoms.  Nothing like presenting symptoms.  She does not think this is due to GERD.  She denies orthopnea, PND, syncope, lower extremity edema or melena.  Past Medical History:  Diagnosis Date  . Depression   . Hyperlipidemia   . Hypertension     Past Surgical History:  Procedure Laterality Date  . APPENDECTOMY    . CESAREAN SECTION    . LAPAROSCOPIC APPENDECTOMY  03/16/2012   Procedure: APPENDECTOMY LAPAROSCOPIC;  Surgeon: Velora Heckler, MD;  Location: [facility];  Service: General;  Laterality: N/A;  . LEFT HEART CATH AND CORONARY ANGIOGRAPHY N/A 12/20/2018   Procedure: LEFT HEART CATH AND CORONARY ANGIOGRAPHY;  Surgeon: Corky Crafts, MD;  Location: [facility] INVASIVE CV LAB;  Service: Cardiovascular;  Laterality: N/A;  . TONSILLECTOMY AND  ADENOIDECTOMY    . TUBAL LIGATION      Current Medications: Prior to Admission medications   Medication Sig Start Date End Date Taking? Authorizing Provider  albuterol (PROVENTIL HFA;VENTOLIN HFA) 108 (90 BASE) MCG/ACT inhaler Inhale 2 puffs into the lungs every 4 (four) hours as needed for wheezing or shortness of breath (cough, shortness of breath or wheezing.). Patient not taking: Reported on 12/18/2018 11/21/14   Dorna Leitz, PA-C  aspirin EC 81 MG EC tablet Take 1 tablet (81 mg total) by mouth daily. 12/21/18   Filbert Schilder, NP  atorvastatin (LIPITOR) 80 MG tablet Take 1 tablet (80 mg total) by mouth daily. Patient not taking: Reported on 12/18/2018 08/08/15 12/18/18  Gildardo Cranker R, DO  atorvastatin (LIPITOR) 80 MG tablet Take 1 tablet (80 mg total) by mouth daily. 12/20/18 05/01/22  Berton Bon, NP  citalopram (CELEXA) 40 MG tablet TAKE ONE TABLET BY MOUTH ONCE DAILY.  Needs visit for any additional refills. Patient taking differently: Take 40 mg by mouth daily.  07/28/16   Sherren Mocha, MD  Flaxseed, Linseed, (FLAXSEED OIL PO) Take 750 mg by mouth daily.    [provider]  lisinopril-hydrochlorothiazide (PRINZIDE,ZESTORETIC) 20-12.5 MG tablet TAKE ONE TABLET BY MOUTH ONCE DAILY Patient taking differently: Take 1 tablet by mouth daily.  09/09/16   Wallis Bamberg, PA-C  Multiple Vitamins-Minerals (ONE-A-DAY WOMENS 50+ ADVANTAGE) TABS Take 1 tablet by mouth daily.     [provider]  nitroGLYCERIN (NITROSTAT) 0.4 MG SL tablet Place 1 tablet (0.4 mg total) under the tongue every 5 (five)  minutes x 3 doses as needed for chest pain. 12/20/18   Filbert Schilder, NP  NON FORMULARY Take 1 tablet by mouth See admin instructions. Neocell (Collagen/vitamin C/Biotin) tablets: Take 1 tablet by mouth once a day    [provider]  NON FORMULARY Take 1 capsule by mouth daily. Leptitox detox capsules: Take 1 capsule by mouth once a day    [provider]  valACYclovir  (VALTREX) 1000 MG tablet Take 1,000 mg by mouth daily. FOR 10 DAYS    [provider]    Allergies:   Codeine; Dilaudid [hydromorphone]; and Morphine and related   Social History   Socioeconomic History  . Marital status: Married    Spouse name: Not on file  . Number of children: Not on file  . Years of education: Not on file  . Highest education level: Not on file  Occupational History  . Not on file  Social Needs  . Financial resource strain: Not on file  . Food insecurity:    Worry: Not on file    Inability: Not on file  . Transportation needs:    Medical: Not on file    Non-medical: Not on file  Tobacco Use  . Smoking status: Former Games developer  . Smokeless tobacco: Never Used  Substance and Sexual Activity  . Alcohol use: Yes    Comment: occassional  . Drug use: No  . Sexual activity: Not on file  Lifestyle  . Physical activity:    Days per week: Not on file    Minutes per session: Not on file  . Stress: Not on file  Relationships  . Social connections:    Talks on phone: Not on file    Gets together: Not on file    Attends religious service: Not on file    Active member of club or organization: Not on file    Attends meetings of clubs or organizations: Not on file    Relationship status: Not on file  Other Topics Concern  . Not on file  Social History Narrative  . Not on file     Family History:  The patient's family history includes Diabetes in her father, sister, and another family member; Diverticulitis in her mother; Heart attack in her father; Heart disease in her father; Hypertension in her mother, sister, and another family member.   ROS:   Please see the history of present illness.    ROS All other systems reviewed and are negative.   PHYSICAL EXAM:   VS:  BP 130/80   Pulse 87   Ht  (1.651 m)   Wt 178 lb 12.8 oz (81.1 kg)   SpO2 97%   BMI 29.75 kg/m    GEN: Well nourished, well developed, in no acute distress  HEENT: normal    Neck: no JVD, carotid bruits, or masses Cardiac: RRR; no murmurs, rubs, or gallops,no edema  Respiratory:  clear to auscultation bilaterally, normal work of breathing GI: soft, nontender, nondistended, + BS MS: no deformity or atrophy  Skin: warm and dry, no rash Neuro:  Alert and Oriented x 3, Strength and sensation are intact Psych: euthymic mood, full affect  Wt Readings from Last 3 Encounters:  01/04/19 178 lb 12.8 oz (81.1 kg)  12/20/18 172 lb 11.2 oz (78.3 kg)  12/31/15 185 lb (83.9 kg)      Studies/Labs Reviewed:   EKG:  EKG is not ordered today.    Recent Labs: 12/18/2018: ALT 27; B Natriuretic  Peptide 54.7 12/20/2018: BUN 9; Creatinine, Ser 0.84; Hemoglobin 12.9; Platelets 278; Potassium 4.0; Sodium 139   Lipid Panel    Component Value Date/Time   CHOL 197 12/19/2018 0602   TRIG 100 12/19/2018 0602   HDL 57 12/19/2018 0602   CHOLHDL 3.5 12/19/2018 0602   VLDL 20 12/19/2018 0602   LDLCALC 120 (H) 12/19/2018 0602    Additional studies/ records that were reviewed today include:  Cardiac catheterization 12/20/2018:  Prox RCA lesion is 30% stenosed.  Prox LAD to Mid LAD lesion is 30% stenosed.  Prox Cx to Mid Cx lesion is 25% stenosed.  The left ventricular systolic function is normal.  LV end diastolic pressure is normal. LVEDP 13 mm Hg.  The left ventricular ejection fraction is 50-55% by visual estimate.  There is no aortic valve stenosis.  Mild, nonobstructive disease. Coronary calcium is present.   Would continue aspirin for now.   Uncelar if troponin elevation came from vasospasm, myopericarditis. WIll check D-dimer to evaluate for PE.   If negative, consider discharge home later today.  Echocardiogram 12/20/2018: IMPRESSIONS  1. The left ventricle has low normal systolic function of 50-55%. The cavity size was normal. There is mildly increased left ventricular wall thickness. Echo evidence of normal diastolic relaxation. 2. The  right ventricle has normal systolic function. The cavity was normal. There is no increase in right ventricular wall thickness. 3. The mitral valve is normal in structure. 4. The tricuspid valve is normal in structure. 5. The aortic valve is normal in structure. There is mild calcification of the aortic valve. 6. The inferior vena cava was dilated in size with <50% respiratory variability. 7. LVEF is approximately 50% with hypokinesis of the mid/distal inferior/inferoseptal walls   ASSESSMENT & PLAN:    1. Mild non obstructive CAD -Intermittent sharp chest pain which feels atypical for angina.  Questionable due to vasospasm as suspected given elevated enzymes in hospital.  Discussed initiation of therapy for possible vasospasm however she wants to defer that for now.  Continue aspirin and statin.  2. HTN -Blood pressure stable on current medication.  3. HLD - 12/19/2018: Cholesterol 197; HDL 57; LDL Cholesterol 120; Triglycerides 100; VLDL 20 -Reduce Lipitor to 40 mg daily per request.  Repeat labs in 3 weeks.  Medication Adjustments/Labs and Tests Ordered: Current medicines are reviewed at length with the patient today.  Concerns regarding medicines are outlined above.  Medication changes, Labs and Tests ordered today are listed in the Patient Instructions below. Patient Instructions  Medication Instructions:  Your physician has recommended you make the following change in your medication:  1.  DECREASE the Lipitor to 40 mg daily  If you need a refill on your cardiac medications before your next appointment, please call your pharmacy.   Lab work: 02/21/2019: FASTING LIPID AND LFT'S   If you have labs (blood work) drawn today and your tests are completely normal, you will receive your results only by: Marland Kitchen MyChart Message (if you have MyChart) OR . A paper copy in the mail If you have any lab test that is abnormal or we need to change your treatment, we will call you to review the  results.  Testing/Procedures: None ordered  Follow-Up: Your physician recommends that you schedule a follow-up appointment in: 4  MONTHS WITH DR. VARANASI  Any Other Special Instructions Will Be Listed Below (If Applicable).       Lorelei Pont, [place]  01/04/2019 3:35 PM    [facility] [place], [place], [place]  [id number] Phone: [phone]; Fax: [phone]

## 2019-01-11 NOTE — Telephone Encounter (Signed)
Attempted to call patient in regards to Cardiac Rehab - LM on VM 

## 2019-01-27 ENCOUNTER — Telehealth (HOSPITAL_COMMUNITY): Payer: Self-pay

## 2019-01-27 NOTE — Telephone Encounter (Signed)
Attempted to call patient in regards to Cardiac Rehab - to let pt know we are closed at this time due to the COVID-19 and will contact once we have resume scheduling.  °LMTCB °

## 2019-02-17 ENCOUNTER — Telehealth (HOSPITAL_COMMUNITY): Payer: Self-pay | Admitting: *Deleted

## 2019-02-17 NOTE — Telephone Encounter (Signed)
Called and left message for pt requesting a call back.  Advised pt that we are currently closed to patients due to the national recommendations for Covid-19. [name] RN, BSN Cardiac and Pulmonary Rehab Nurse Navigator    

## 2019-03-02 ENCOUNTER — Telehealth (HOSPITAL_COMMUNITY): Payer: Self-pay | Admitting: *Deleted

## 2019-03-02 NOTE — Telephone Encounter (Signed)
Called and left message on cell phone voicemail about the cardiac rehab referral, continued closure due to Covid-19 and any needs for educational resources on diet and exercise. Requesting a call back. Since unable to make contact with pt by phone x 3 calls. Will send letter to please contact. Alanson Aly, BSN Cardiac and Emergency planning/management officer

## 2019-04-19 ENCOUNTER — Telehealth: Payer: Self-pay

## 2019-04-19 NOTE — Telephone Encounter (Signed)
Virtual Visit Pre-Appointment Phone Call  TELEPHONE CALL NOTE  Candice Mcbride has been deemed a candidate for a follow-up tele-health visit to limit community exposure during the Covid-19 pandemic. I spoke with the patient via phone to ensure availability of phone/video source, confirm preferred email & phone number, and discuss instructions and expectations.  I reminded Candice Mcbride to be prepared with any vital sign and/or heart rhythm information that could potentially be obtained via home monitoring, at the time of her visit. I reminded Candice Mcbride to expect a phone call prior to her visit.  Patient agrees to consent below.  Cleon Gustin, RN 04/19/2019 2:32 PM  FULL LENGTH CONSENT FOR TELE-HEALTH VISIT   I hereby voluntarily request, consent and authorize [facility] and its employed or contracted physicians, physician assistants, nurse practitioners or other licensed health care professionals (the Practitioner), to provide me with telemedicine health care services (the "Services") as deemed necessary by the treating Practitioner. I acknowledge and consent to receive the Services by the Practitioner via telemedicine. I understand that the telemedicine visit will involve communicating with the Practitioner through live audiovisual communication technology and the disclosure of certain medical information by electronic transmission. I acknowledge that I have been given the opportunity to request an in-person assessment or other available alternative prior to the telemedicine visit and am voluntarily participating in the telemedicine visit.  I understand that I have the right to withhold or withdraw my consent to the use of telemedicine in the course of my care at any time, without affecting my right to future care or treatment, and that the Practitioner or I may terminate the telemedicine visit at any time. I understand that I have the right to inspect all information obtained  and/or recorded in the course of the telemedicine visit and may receive copies of available information for a reasonable fee.  I understand that some of the potential risks of receiving the Services via telemedicine include:  Marland Kitchen Delay or interruption in medical evaluation due to technological equipment failure or disruption; . Information transmitted may not be sufficient (e.g. poor resolution of images) to allow for appropriate medical decision making by the Practitioner; and/or  . In rare instances, security protocols could fail, causing a breach of personal health information.  Furthermore, I acknowledge that it is my responsibility to provide information about my medical history, conditions and care that is complete and accurate to the best of my ability. I acknowledge that Practitioner's advice, recommendations, and/or decision may be based on factors not within their control, such as incomplete or inaccurate data provided by me or distortions of diagnostic images or specimens that may result from electronic transmissions. I understand that the practice of medicine is not an exact science and that Practitioner makes no warranties or guarantees regarding treatment outcomes. I acknowledge that I will receive a copy of this consent concurrently upon execution via email to the email address I last provided but may also request a printed copy by calling the office of [place].    I understand that my insurance will be billed for this visit.   I have read or had this consent read to me. . I understand the contents of this consent, which adequately explains the benefits and risks of the Services being provided via telemedicine.  . I have been provided ample opportunity to ask questions regarding this consent and the Services and have had my questions answered to my satisfaction. . I give  my informed consent for the services to be provided through the use of telemedicine in my medical care  By  participating in this telemedicine visit I agree to the above.

## 2019-04-25 ENCOUNTER — Ambulatory Visit: Payer: BLUE CROSS/BLUE SHIELD | Admitting: Interventional Cardiology

## 2019-05-06 ENCOUNTER — Telehealth (HOSPITAL_COMMUNITY): Payer: Self-pay

## 2019-05-06 NOTE — Telephone Encounter (Signed)
No response from pt, closed referral. °

## 2019-05-10 ENCOUNTER — Telehealth: Payer: Self-pay | Admitting: Interventional Cardiology

## 2019-05-10 NOTE — Progress Notes (Deleted)
{Choose 1 Note Type (Telehealth Visit or Telephone Visit):[phone]}   Date:  05/10/2019   ID:  Candice Mcbride, DOB 30-May-1961, MRN [id number]  {Patient Location:[phone]::"Home"} {Provider [phone]::"Home"}  PCP:  Mitchel HonourMorris, Megan, DO  Cardiologist:  No primary care provider on file. *** Electrophysiologist:  None   Evaluation Performed:  {Choose Visit Type:[phone]::"Follow-Up Visit"}  Chief Complaint:  ***  History of Present Illness:    Candice Mcbride is a 58 y.o. female with HTN, HL, depression who presented on 02/09/2020with sudden onset epigastric discomfort and nausea, found to have NSTEMI. Serial ECGs completed and concern for subtle ST elevation in inferolateral leads led to activation for STEMI. Further evaluation showed <661mm upsloping elevation and thus code STEMI cancelled. Troponin peaked at 0.96. treated with heparin. Cath revealed mild, nonobstructive diseasewith coronary calciumpresent. LV was estimated at 50-55% by visual estimate confirmed by echocardiogram. Recommendations were for ASA 81 daily for now. Per cath note, it is unclear if troponinelevation came from vasospasm, myopericarditis.AD-dimerwas checkedto evaluate for PE which was found to be negative at <0.27.   The patient {does/does not:200015} have symptoms concerning for COVID-19 infection (fever, chills, cough, or new shortness of breath).    Past Medical History:  Diagnosis Date  . Depression   . Hyperlipidemia   . Hypertension    Past Surgical History:  Procedure Laterality Date  . APPENDECTOMY    . CESAREAN SECTION    . LAPAROSCOPIC APPENDECTOMY  03/16/2012   Procedure: APPENDECTOMY LAPAROSCOPIC;  Surgeon: Velora Hecklerodd M Gerkin, MD;  Location: [facility];  Service: General;  Laterality: N/A;  . LEFT HEART CATH AND CORONARY ANGIOGRAPHY N/A 12/20/2018   Procedure: LEFT HEART CATH AND CORONARY ANGIOGRAPHY;  Surgeon: Corky CraftsVaranasi, Brazen Domangue S, MD;  [facility];  Service:  Cardiovascular;  Laterality: N/A;  . TONSILLECTOMY AND ADENOIDECTOMY    . TUBAL LIGATION       No outpatient medications have been marked as taking for the 05/11/19 encounter (Appointment) with Corky CraftsVaranasi, Donnalee Cellucci S, MD.     Allergies:   Codeine, Dilaudid [hydromorphone], and Morphine and related   Social History   Tobacco Use  . Smoking status: Former Games developermoker  . Smokeless tobacco: Never Used  Substance Use Topics  . Alcohol use: Yes    Comment: occassional  . Drug use: No     Family Hx: The patient's family history includes Diabetes in her father, sister, and another family member; Diverticulitis in her mother; Heart attack in her father; Heart disease in her father; Hypertension in her mother, sister, and another family member.  ROS:   Please see the history of present illness.    *** All other systems reviewed and are negative.   Prior CV studies:   The following studies were reviewed today:  Cardiac catheterization 12/20/2018:  Prox RCA lesion is 30% stenosed.  Prox LAD to Mid LAD lesion is 30% stenosed.  Prox Cx to Mid Cx lesion is 25% stenosed.  The left ventricular systolic function is normal.  LV end diastolic pressure is normal. LVEDP 13 mm Hg.  The left ventricular ejection fraction is 50-55% by visual estimate.  There is no aortic valve stenosis.  Mild, nonobstructive disease. Coronary calcium is present.   Labs/Other Tests and Data Reviewed:    EKG:  {EKG/Telemetry Strips Reviewed:[phone]}  Recent Labs: 12/18/2018: ALT 27; B Natriuretic Peptide 54.7 12/20/2018: BUN 9; Creatinine, Ser 0.84; Hemoglobin 12.9; Platelets 278; Potassium 4.0; Sodium 139   Recent Lipid Panel Lab Results  Component Value Date/Time  CHOL 197 12/19/2018 06:02 AM   TRIG 100 12/19/2018 06:02 AM   HDL 57 12/19/2018 06:02 AM   CHOLHDL 3.5 12/19/2018 06:02 AM   LDLCALC 120 (H) 12/19/2018 06:02 AM    Wt Readings from Last 3 Encounters:  01/04/19 178 lb 12.8 oz (81.1  kg)  12/20/18 172 lb 11.2 oz (78.3 kg)  12/31/15 185 lb (83.9 kg)     Objective:    Vital Signs:  There were no vitals taken for this visit.   {[facility] Virtual Exam (Optional):[phone]::"VITAL SIGNS:  reviewed"}  ASSESSMENT & PLAN:    1. Mild- moderate nonobstructive CAD: 2. HTN: 3. Hyperlipidemia:  COVID-19 Education: The signs and symptoms of COVID-19 were discussed with the patient and how to seek care for testing (follow up with PCP or arrange E-visit).  ***The importance of social distancing was discussed today.  Time:   Today, I have spent *** minutes with the patient with telehealth technology discussing the above problems.     Medication Adjustments/Labs and Tests Ordered: Current medicines are reviewed at length with the patient today.  Concerns regarding medicines are outlined above.   Tests Ordered: No orders of the defined types were placed in this encounter.   Medication Changes: No orders of the defined types were placed in this encounter.   Follow Up:  {F/U [phone]} {follow up:15908}  Signed, Larae Grooms, MD  05/10/2019 10:30 AM    [facility]

## 2019-05-10 NOTE — Telephone Encounter (Signed)
New Message     Left Message to confirm appt

## 2019-05-11 ENCOUNTER — Telehealth: Payer: BLUE CROSS/BLUE SHIELD | Admitting: Interventional Cardiology

## 2019-07-08 ENCOUNTER — Encounter: Payer: Self-pay | Admitting: Interventional Cardiology

## 2019-07-29 ENCOUNTER — Ambulatory Visit: Payer: BC Managed Care – PPO | Admitting: Interventional Cardiology

## 2019-10-03 ENCOUNTER — Other Ambulatory Visit: Payer: Self-pay

## 2019-10-03 DIAGNOSIS — Z20822 Contact with and (suspected) exposure to covid-19: Secondary | ICD-10-CM

## 2019-10-05 LAB — NOVEL CORONAVIRUS, NAA: SARS-CoV-2, NAA: NOT DETECTED

## 2020-09-13 IMAGING — CT CT ANGIO CHEST-ABD-PELV FOR DISSECTION W/ AND WO/W CM
2 of 9 series · 14 of 46 positions shown, 16 images · IV contrast (iopamidol)
Comparison: Chest radiograph from earlier today.

CLINICAL DATA: Sudden onset chest pain radiating to back and
abdomen. Prior appendectomy.

EXAM:
CT ANGIOGRAPHY CHEST, ABDOMEN AND PELVIS
TECHNIQUE: Multidetector CT imaging through the chest, abdomen and pelvis was
performed using the standard protocol during bolus administration of
intravenous contrast. Multiplanar reconstructed images and MIPs were
obtained and reviewed to evaluate the vascular anatomy.
CONTRAST:  100mL [id number] IOPAMIDOL ([id number]) INJECTION 76%

[Series 7: dissection 3.0 i30f 3 · axial · 0.98mm/px · z∈[+897,+1440]mm · 11 of 207 slices shown, 13 images]
[im 13/207  soft-tissue]
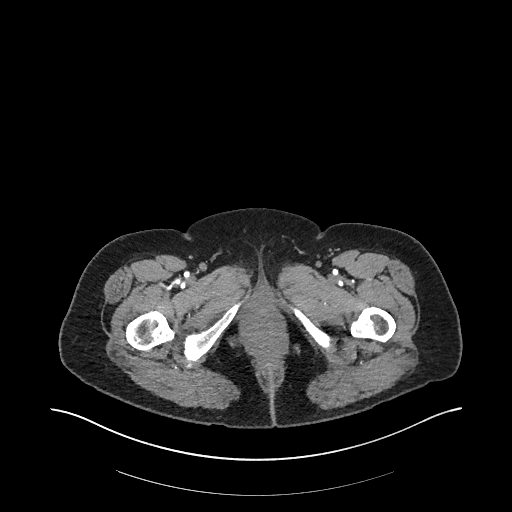
[im 13/207  bone]
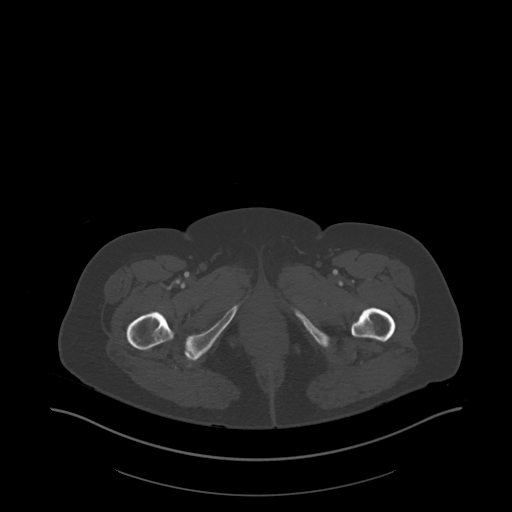
[im 37/207  soft-tissue]
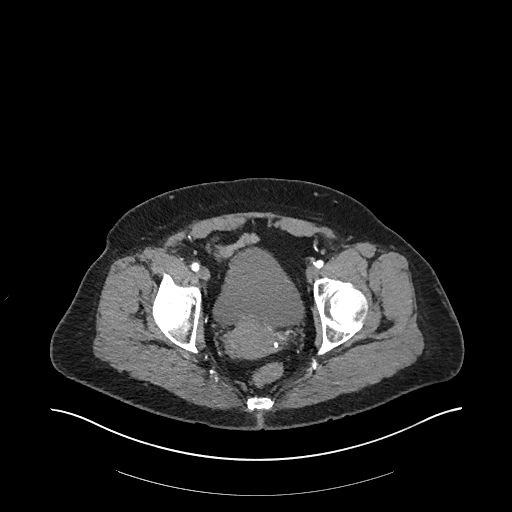
[im 49/207  soft-tissue]
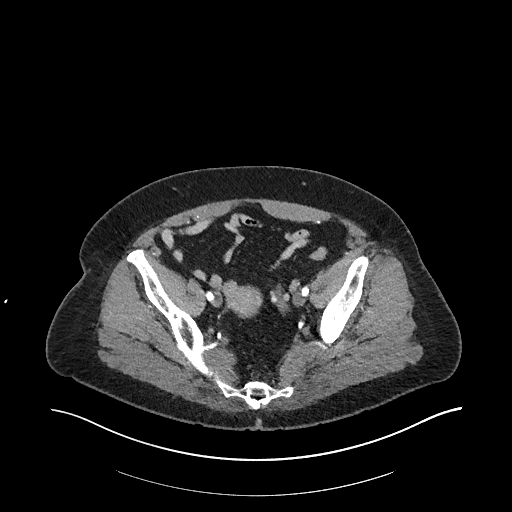
[im 73/207  soft-tissue]
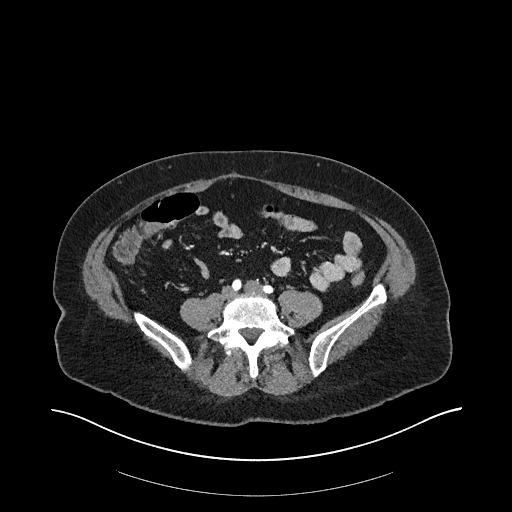
[im 85/207  soft-tissue]
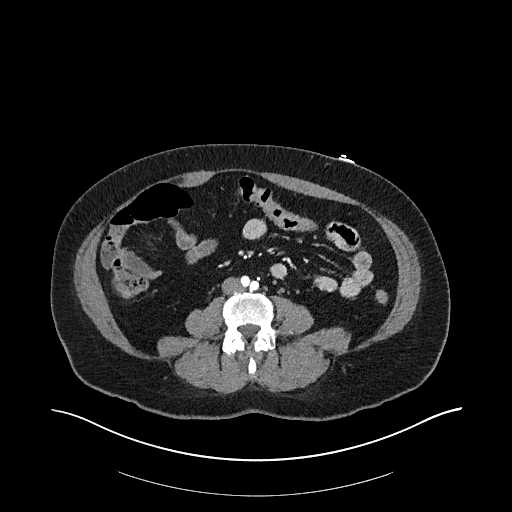
[im 110/207  soft-tissue]
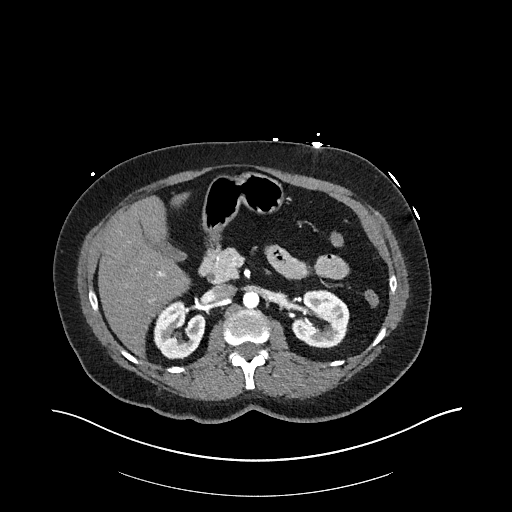
[im 122/207  soft-tissue]
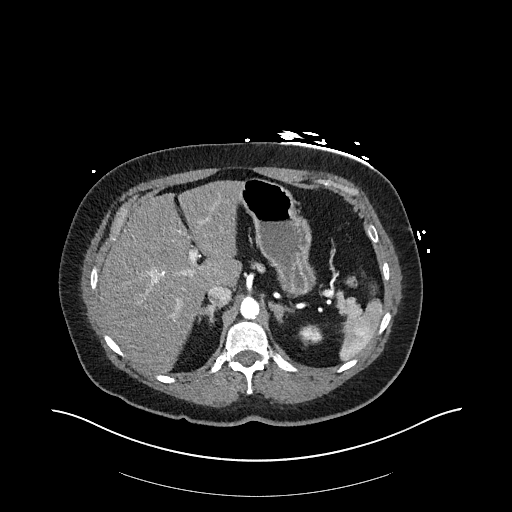
[im 134/207  soft-tissue]
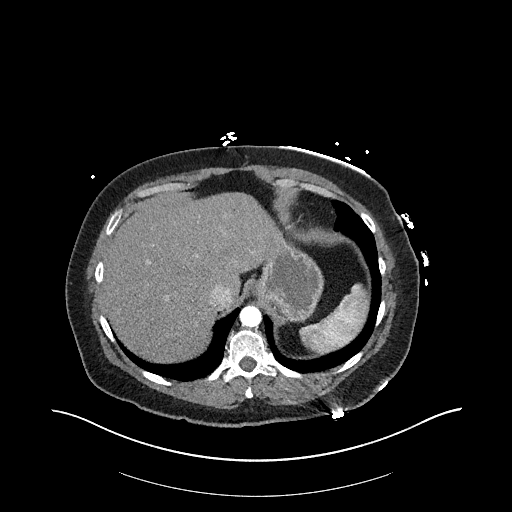
[im 158/207  soft-tissue]
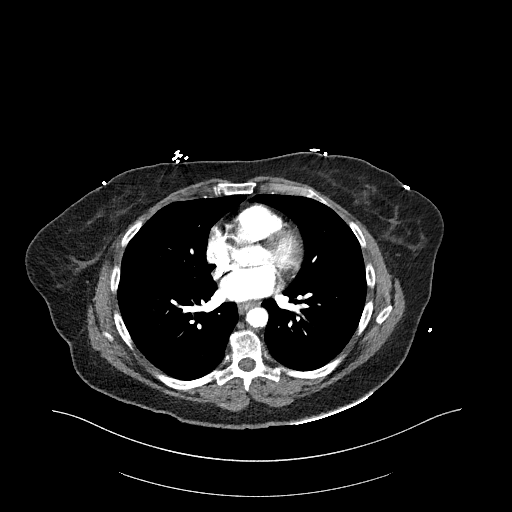
[im 158/207  bone]
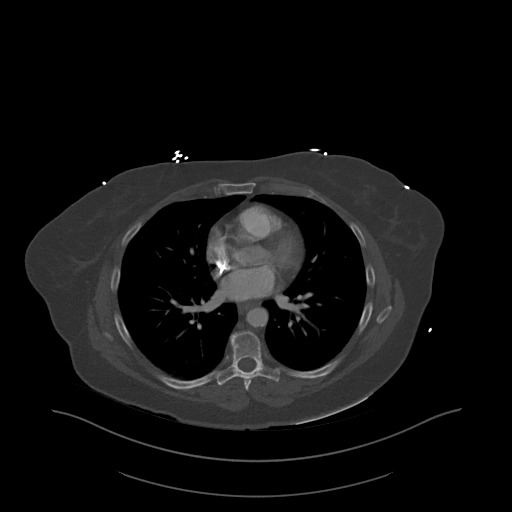
[im 170/207  soft-tissue]
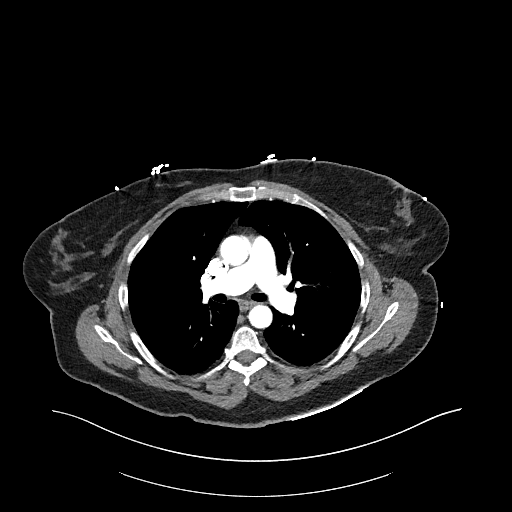
[im 194/207  soft-tissue]
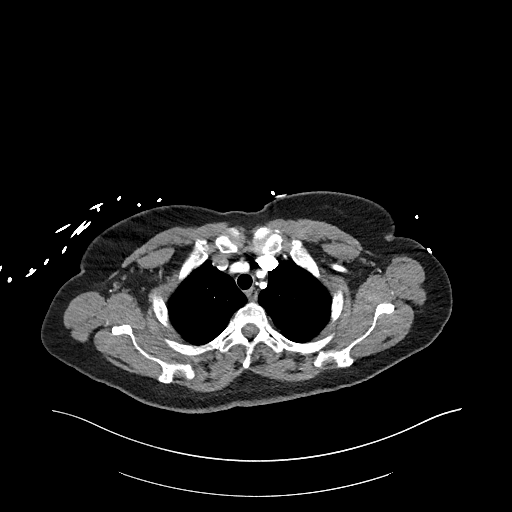

[Series 10: coronals · coronal · 0.91mm/px · 3 of 151 slices shown]
[im 38/151  soft-tissue]
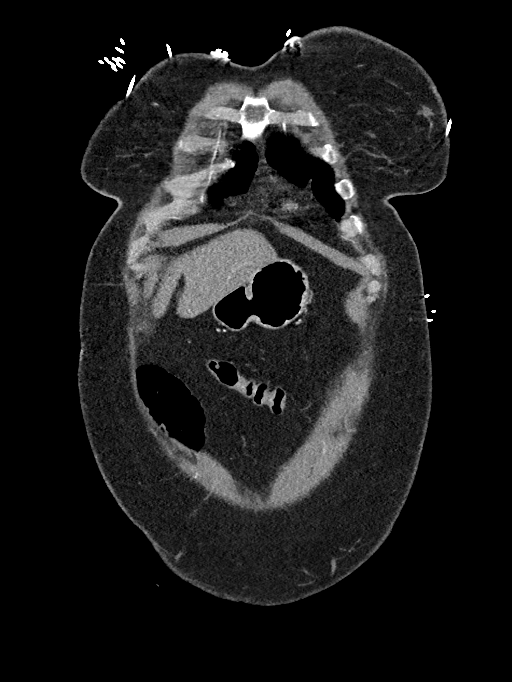
[im 76/151  soft-tissue]
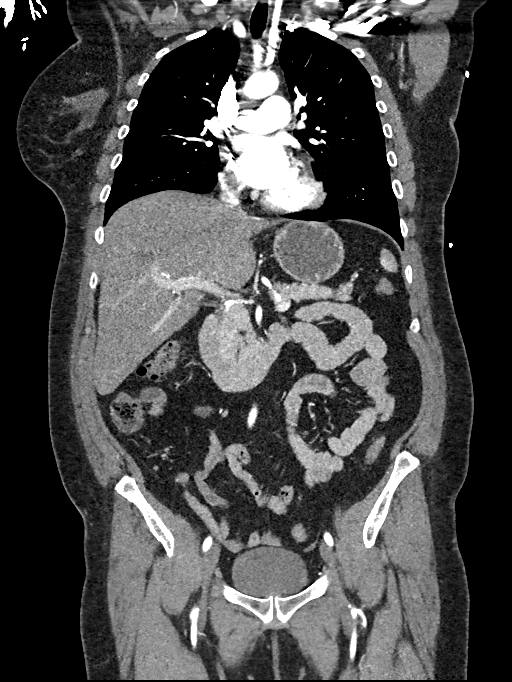
[im 113/151  soft-tissue]
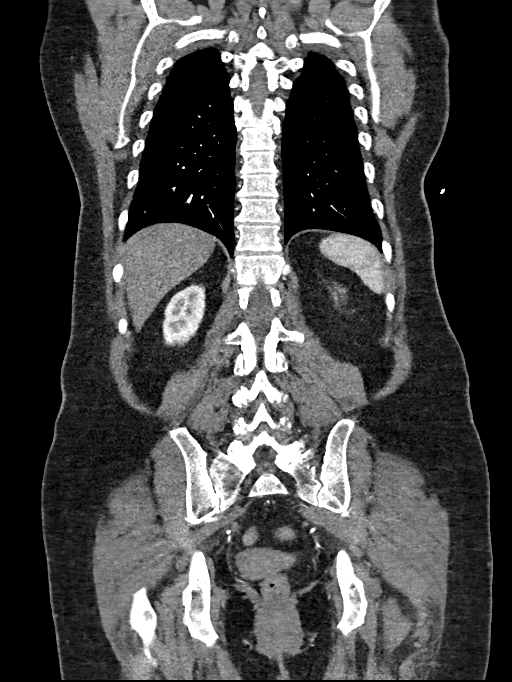

[14 of 46 positions shown; findings below may reference images not displayed]

FINDINGS: CTA CHEST FINDINGS

Cardiovascular: Normal heart size. No significant pericardial
effusion/thickening. Left anterior descending and right coronary
atherosclerosis. Atherosclerotic nonaneurysmal thoracic aorta. No
evidence of acute intramural hematoma, dissection, pseudoaneurysm or
penetrating atherosclerotic ulcer in the thoracic aorta. Patent
aortic arch vessels. Normal caliber pulmonary arteries. No central
pulmonary emboli.

Mediastinum/Nodes: No discrete thyroid nodules. Unremarkable
esophagus. No pathologically enlarged axillary, mediastinal or hilar
lymph nodes.

Lungs/Pleura: No pneumothorax. No pleural effusion. No acute
consolidative airspace disease, lung masses or significant pulmonary
nodules.

Musculoskeletal: No aggressive appearing focal osseous lesions. Mild
thoracic spondylosis.

Review of the MIP images confirms the above findings.

CTA ABDOMEN AND PELVIS FINDINGS

VASCULAR

Aorta: Atherosclerotic abdominal aorta. Normal caliber aorta without
aneurysm, dissection, vasculitis or significant stenosis.

Celiac: Patent without evidence of aneurysm, dissection, vasculitis
or significant stenosis.

SMA: Patent without evidence of aneurysm, dissection, vasculitis or
significant stenosis.

Renals: Single bilateral renal arteries are patent without evidence
of aneurysm, dissection, vasculitis, fibromuscular dysplasia or
significant stenosis.

IMA: Patent without evidence of aneurysm, dissection, vasculitis or
significant stenosis.

Inflow: Patent without evidence of aneurysm, dissection, vasculitis
or significant stenosis.

Veins: No obvious venous abnormality within the limitations of this
arterial phase study.

Review of the MIP images confirms the above findings.

NON-VASCULAR

Hepatobiliary: Normal liver with no liver mass. Normal gallbladder
with no radiopaque cholelithiasis. No biliary ductal dilatation.

Pancreas: Normal, with no mass or duct dilation.

Spleen: Normal size. No mass.

Adrenals/Urinary Tract: Normal adrenals. Normal kidneys with no
hydronephrosis and no renal mass. Normal bladder.

Stomach/Bowel: Small hiatal hernia. Otherwise normal nondistended
stomach. Normal caliber small bowel with no small bowel wall
thickening. Appendectomy. Mild sigmoid diverticulosis, with no large
bowel wall thickening or significant pericolonic fat stranding.

Vascular/Lymphatic: Atherosclerotic nonaneurysmal abdominal aorta.
No pathologically enlarged lymph nodes in the abdomen or pelvis.

Reproductive: Grossly normal uterus.  No adnexal mass.

Other: No pneumoperitoneum, ascites or focal fluid collection.

Musculoskeletal: No aggressive appearing focal osseous lesions.

Review of the MIP images confirms the above findings.
IMPRESSION: 1. No acute aortic syndrome.
2. Two-vessel coronary atherosclerosis.
3. Small hiatal hernia.
4. Mild sigmoid diverticulosis.
5. Aortic Atherosclerosis ([id number]).
